# Patient Record
Sex: Female | Born: 1995 | Race: White | Hispanic: No | Marital: Single | Smoking: Current every day smoker
Health system: Southern US, Community
[De-identification: ages and names within clinical notes are randomized; demographics above are authoritative.]

## PROBLEM LIST (undated history)

## (undated) DIAGNOSIS — F32A Depression, unspecified: Secondary | ICD-10-CM

## (undated) DIAGNOSIS — J45909 Unspecified asthma, uncomplicated: Secondary | ICD-10-CM

## (undated) DIAGNOSIS — F909 Attention-deficit hyperactivity disorder, unspecified type: Secondary | ICD-10-CM

## (undated) DIAGNOSIS — F329 Major depressive disorder, single episode, unspecified: Secondary | ICD-10-CM

## (undated) HISTORY — PX: TYMPANOPLASTY: SHX33

## (undated) HISTORY — PX: TONSILLECTOMY AND ADENOIDECTOMY: SUR1326

## (undated) HISTORY — PX: TOOTH EXTRACTION: SUR596

---
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
## 2017-06-15 ENCOUNTER — Other Ambulatory Visit: Payer: Self-pay

## 2017-06-15 ENCOUNTER — Ambulatory Visit
Admission: EM | Admit: 2017-06-15 | Discharge: 2017-06-15 | Disposition: A | Discharge status: Home / Self Care | Payer: Medicaid Other | Attending: Family Medicine | Admitting: Family Medicine

## 2017-06-15 ENCOUNTER — Encounter: Payer: Self-pay | Admitting: Emergency Medicine

## 2017-06-15 DIAGNOSIS — R21 Rash and other nonspecific skin eruption: Secondary | ICD-10-CM

## 2017-06-15 MED ORDER — PREDNISONE 10 MG PO TABS: ORAL_TABLET | ORAL | 0 refills | Status: DC

## 2017-06-15 NOTE — ED Provider Notes (Signed)
[facility]    CSN: [id number] Arrival date & time: 06/15/17  1415  History   Chief Complaint Chief Complaint  Patient presents with  . Rash   HPI  21 year old female presents with rash.  Started Thanksgiving day.  Located on the arms, armpits, back.  Severe, worsening. Severely pruritic.  She has been applying calamine lotion without improvement.  She feels like it may be poison oak or poison ivy but is unsure of being exposed.  No known exacerbating factors.  No other associated symptoms.  No other complaints this time.  PMH - See problem list  Patient Active Problem List   Diagnosis Date Noted  . Suicidal ideation 10/03/2016  . Severe recurrent major depression without psychotic features (HCC) 10/02/2016  . MDD (major depressive disorder), recurrent episode, moderate (HCC) 05/15/2016  . Cannabis use disorder, moderate, dependence (HCC) 05/15/2016  . Methamphetamine use disorder, moderate (HCC) 05/15/2016  . Tobacco use disorder 05/14/2016  . Borderline personality disorder (HCC) 05/14/2016  . PTSD (post-traumatic stress disorder) 05/13/2016  . Suicide attempt (HCC) 09/26/2011   Past Surgical History:  Procedure Laterality Date  . TONSILLECTOMY AND ADENOIDECTOMY  Age 21  . TOOTH EXTRACTION  age 21  . TYMPANOPLASTY     OB History    No data available     Home Medications    Prior to Admission medications   Medication Sig Start Date End Date Taking? Authorizing Provider  albuterol (PROVENTIL HFA;VENTOLIN HFA) 108 (90 Base) MCG/ACT inhaler Inhale 1-2 puffs into the lungs every 6 (six) hours as needed for wheezing or shortness of breath. 09/06/16  Yes Payton Mccallumonty, [place], MD  traZODone (DESYREL) 50 MG tablet Take 1 tablet (50 mg total) by mouth at bedtime. 10/06/16  Yes Pucilowska, Jolanta B, MD  UNABLE TO FIND L-Mythel Folate 15mg  daily   Yes [provider]  venlafaxine XR (EFFEXOR-XR) 150 MG 24 hr capsule Take 150 mg by mouth daily with breakfast.    Yes [provider]  cetirizine (ZYRTEC) 10 MG tablet Take 1 tablet by mouth daily. 11/13/16   [provider]  ondansetron (ZOFRAN ODT) 8 MG disintegrating tablet Take 1 tablet (8 mg total) by mouth 2 (two) times daily. 12/24/16   Lutricia Feiloemer, William P, PA-C  predniSONE (DELTASONE) 10 MG tablet 50 mg (5 tablets) daily x 3 days, then 40 mg (4 tablets) daily x 3 days, then 30 mg (3 tablets) daily x 3 days, then 20 mg (2 tablets) daily x 3 days, then 10 mg (1 tablet) daily x 3 days. 06/15/17   Tommie Samsook, Deborrah Mabin G, DO   Family History Family History  Problem Relation Age of Onset  . Mental illness Mother   . Depression Father    Social History Social History   Tobacco Use  . Smoking status: Current Every Day Smoker    Packs/day: 0.25    Types: Cigarettes  . Smokeless tobacco: Never Used  Substance Use Topics  . Alcohol use: Yes    Frequency: Never    Comment: uses less than once monthly  . Drug use: Yes    Types: Marijuana    Comment: last smoked a couple of days ago   Allergies   Folic acid and Nickel   Review of Systems Review of Systems  Constitutional: Negative.   Skin: Positive for rash.       Itching.    Physical Exam Triage Vital Signs ED Triage Vitals  Enc Vitals Group     BP 06/15/17 1429  115/63     Pulse Rate 06/15/17 1429 82     Resp 06/15/17 1429 16     Temp 06/15/17 1429 98.3 F (36.8 C)     Temp Source 06/15/17 1429 Oral     SpO2 06/15/17 1429 99 %     Weight 06/15/17 1429 210 lb (95.3 kg)     Height 06/15/17 1429 5\' 3"  (1.6 m)     Head Circumference --      Peak Flow --      Pain Score 06/15/17 1430 5     Pain Loc --      Pain Edu? --      Excl. in GC? --    No data found.  Updated Vital Signs BP 115/63 (BP Location: Left Arm)   Pulse 82   Temp 98.3 F (36.8 C) (Oral)   Resp 16   Ht 5\' 3"  (1.6 m)   Wt 210 lb (95.3 kg)   LMP 06/10/2017 (Approximate)   SpO2 99%   BMI 37.20 kg/m   Visual Acuity Right Eye Distance:   Left Eye  Distance:   Bilateral Distance:    Right Eye Near:   Left Eye Near:    Bilateral Near:     Physical Exam  Constitutional: She is oriented to person, place, and time. She appears well-developed. No distress.  HENT:  Head: Normocephalic and atraumatic.  Nose: Nose normal.  Eyes: Conjunctivae are normal. No scleral icterus.  Cardiovascular: Normal rate and regular rhythm.  Pulmonary/Chest: Effort normal and breath sounds normal.  Neurological: She is alert and oriented to person, place, and time.  Skin:  Diffuse erythematous papular rash noted. Worse in the axilla.  Psychiatric: She has a normal mood and affect. Her behavior is normal.  Vitals reviewed.  UC Treatments / Results  Labs (all labs ordered are listed, but only abnormal results are displayed) Labs Reviewed - No data to display  EKG  EKG Interpretation None       Radiology No results found.  Procedures Procedures (including critical care time)  Medications Ordered in UC Medications - No data to display   Initial Impression / Assessment and Plan / UC Course  I have reviewed the triage vital signs and the nursing notes.  Pertinent labs & imaging results that were available during my care of the patient were reviewed by me and considered in my medical decision making (see chart for details).     21 year old female presents with rash. Contact dermatitis (possible poison ivy/oak/sumac). Treating with Prednisone. Warned about potential mood disturbance.  Final Clinical Impressions(s) / UC Diagnoses   Final diagnoses:  Rash    ED Discharge Orders        Ordered    predniSONE (DELTASONE) 10 MG tablet     06/15/17 1450     Controlled Substance Prescriptions [place] Controlled Substance Registry consulted? Not Applicable   Tommie SamsCook, Othella Slappey G, DO 06/15/17 1529

## 2017-06-15 NOTE — Discharge Instructions (Signed)
Prednisone as prescribed. ° °Take care ° °Dr. Hannan Tetzlaff  °

## 2017-06-15 NOTE — ED Triage Notes (Signed)
Patient in today c/o 4 days of rash. Patient states the rash is very itchy. She has used Calamine lotion without relief. Patient denies any new detergents, lotions or soaps.

## 2017-06-19 IMAGING — CR DG CHEST 2V
2 series · 2 of 2 positions shown · non-contrast
Comparison: None.

CLINICAL DATA: Cough and shortness of breath for 1 week.

EXAM:
CHEST  2 VIEW

[chest pa]
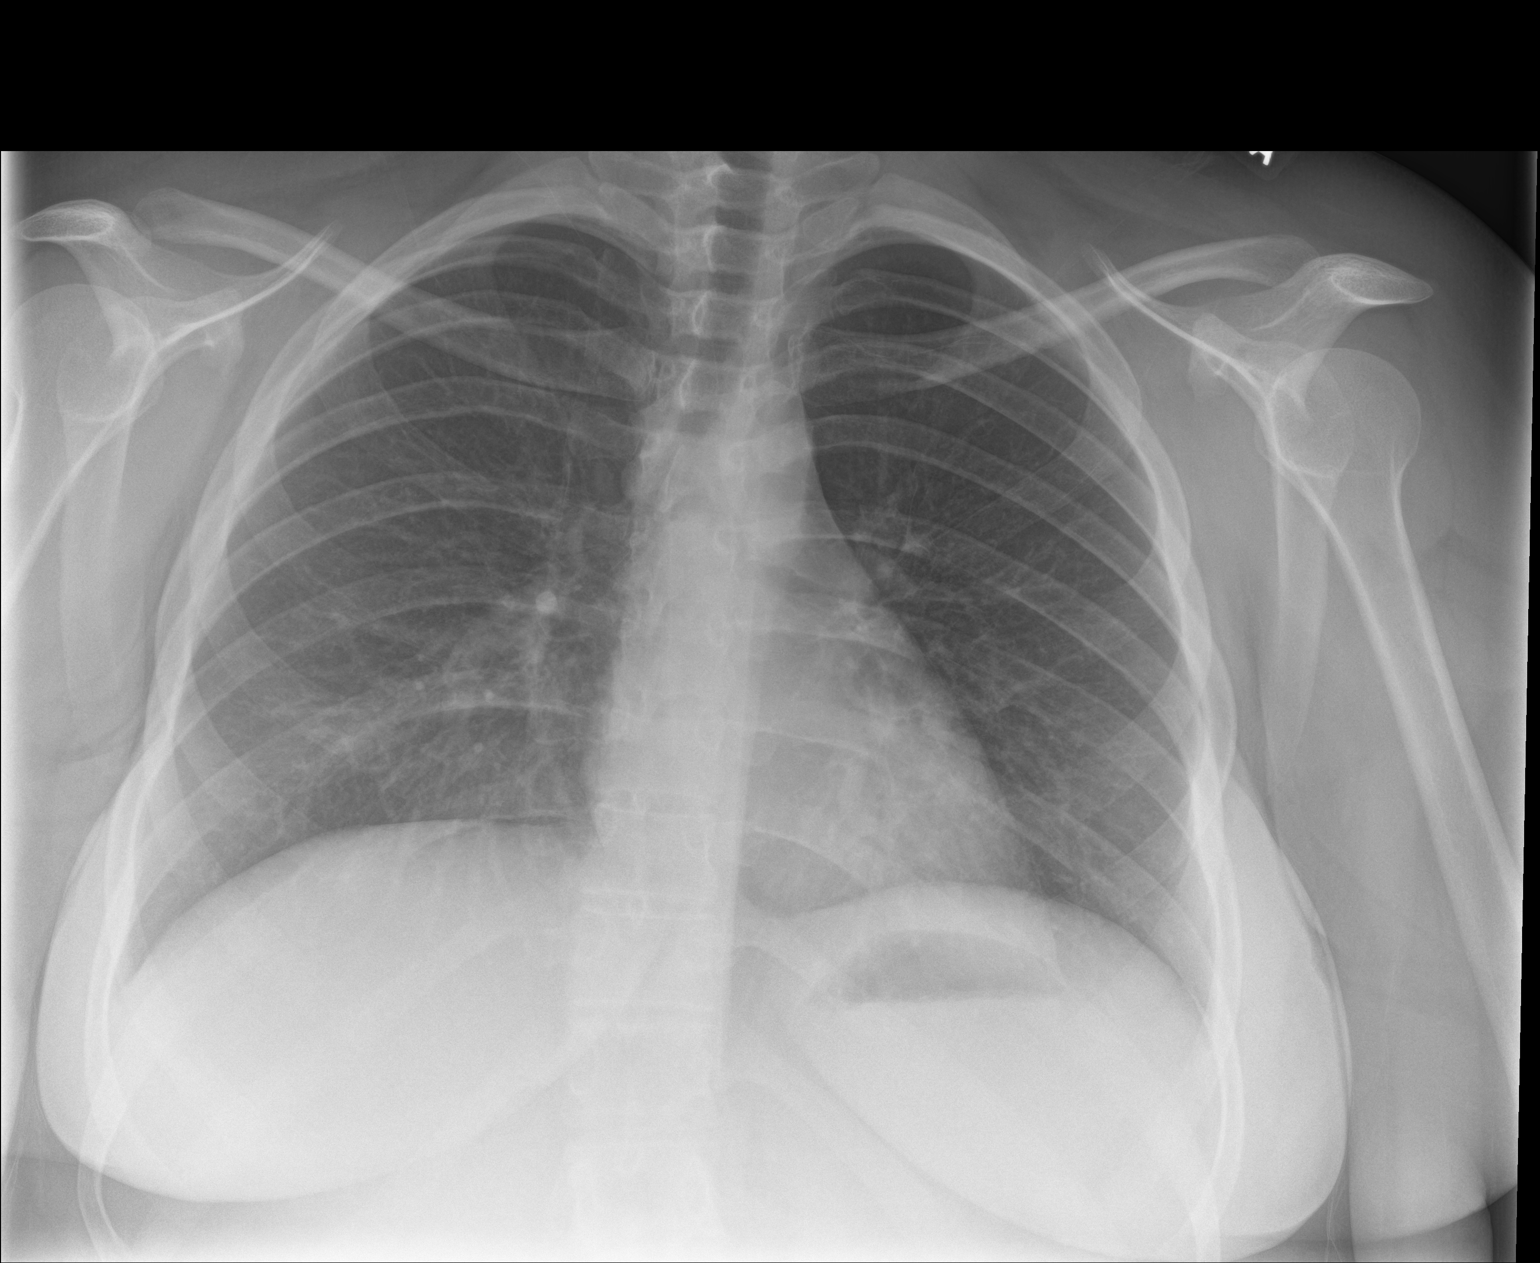

[chest lat]
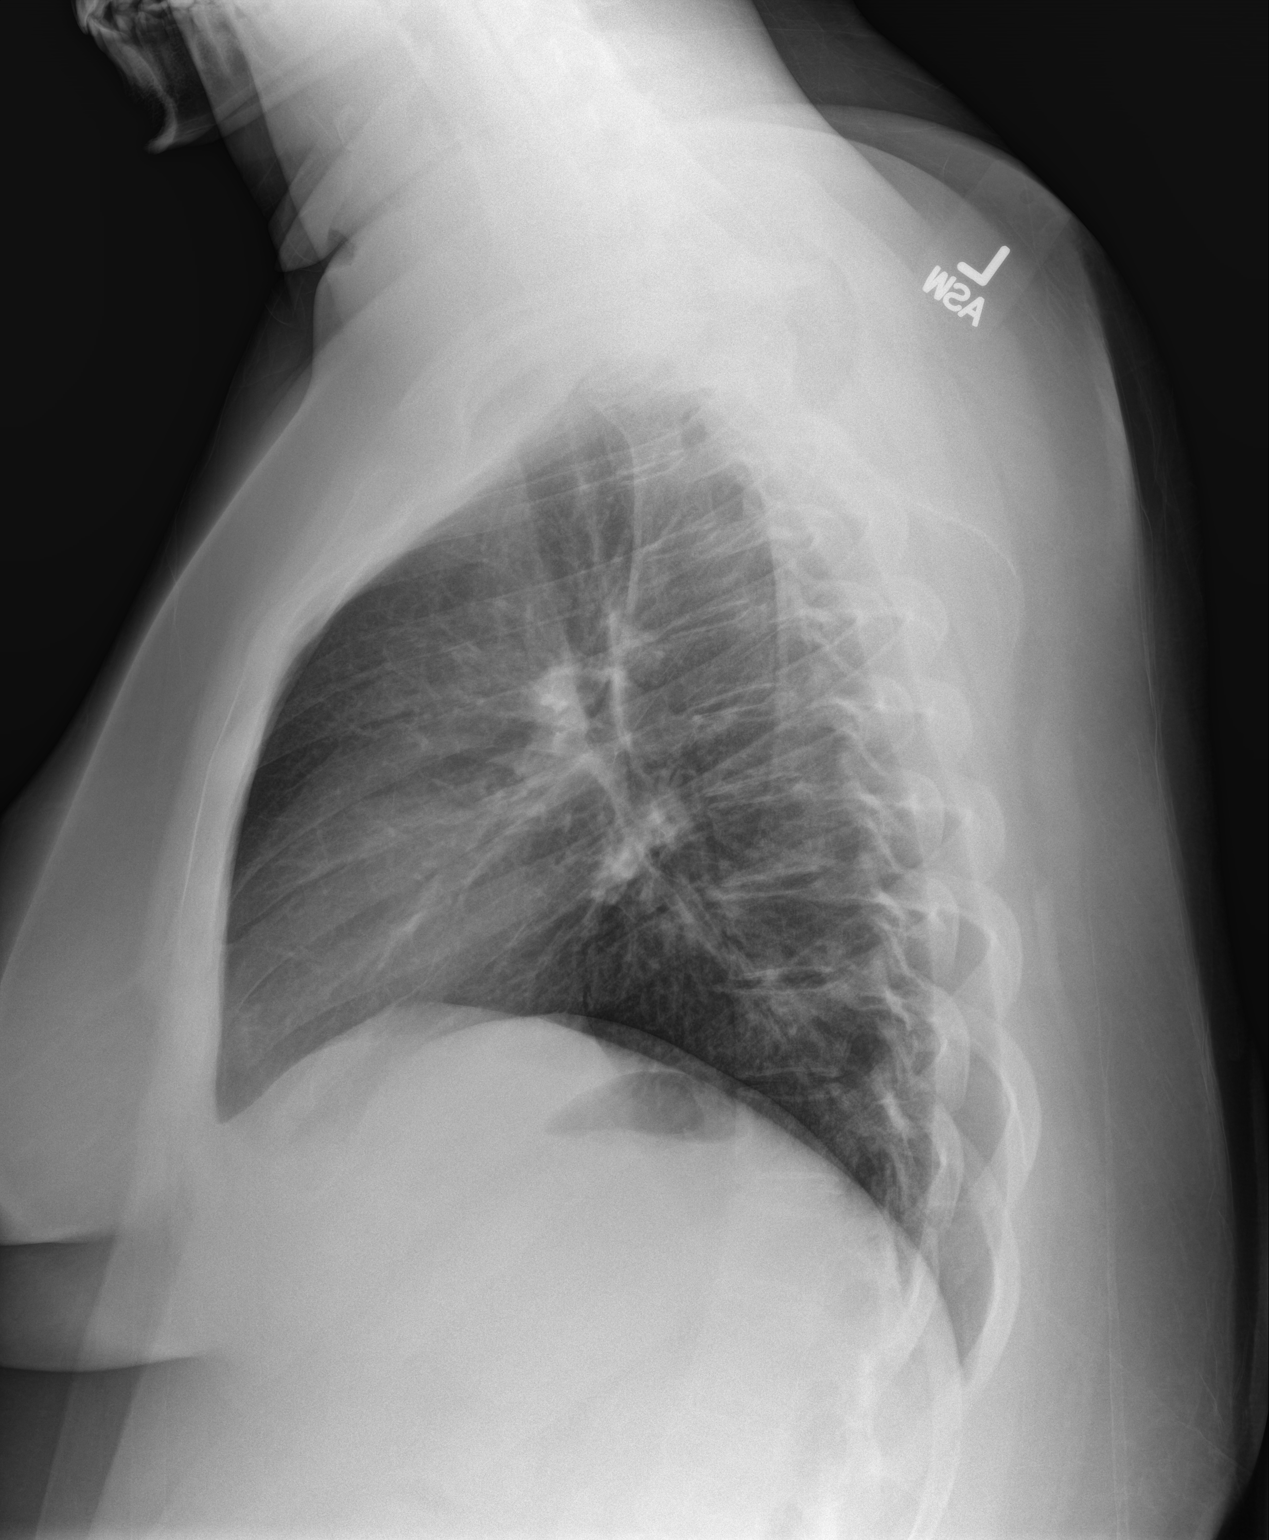

[2 of 2 positions shown; findings below may reference images not displayed]

FINDINGS: The heart size and mediastinal contours are within normal limits.
Both lungs are clear. The visualized skeletal structures are
unremarkable.
IMPRESSION: Normal examination.

## 2017-08-12 ENCOUNTER — Ambulatory Visit
Admission: EM | Admit: 2017-08-12 | Discharge: 2017-08-12 | Disposition: A | Discharge status: Home / Self Care | Payer: Medicaid Other | Attending: Student | Admitting: Student

## 2017-08-12 ENCOUNTER — Other Ambulatory Visit: Payer: Self-pay

## 2017-08-12 ENCOUNTER — Encounter: Payer: Self-pay | Admitting: Emergency Medicine

## 2017-08-12 DIAGNOSIS — J069 Acute upper respiratory infection, unspecified: Secondary | ICD-10-CM

## 2017-08-12 DIAGNOSIS — B9789 Other viral agents as the cause of diseases classified elsewhere: Principal | ICD-10-CM

## 2017-08-12 HISTORY — DX: Unspecified asthma, uncomplicated: J45.909

## 2017-08-12 MED ORDER — GUAIFENESIN 100 MG/5ML PO LIQD: 100.0000 mg | Freq: Every evening | ORAL | 0 refills | Status: DC | PRN

## 2017-08-12 MED ORDER — AZITHROMYCIN 250 MG PO TABS: ORAL_TABLET | ORAL | 0 refills | Status: DC

## 2017-08-12 MED ORDER — BENZONATATE 100 MG PO CAPS: 100.0000 mg | ORAL_CAPSULE | Freq: Three times a day (TID) | ORAL | 0 refills | Status: DC

## 2017-08-12 NOTE — ED Triage Notes (Signed)
Patient c/o cough and chest congestion that started last night.  

## 2017-08-12 NOTE — Discharge Instructions (Signed)
-  Take z-pak as instructed. -Take inhaler daily at home. -Zyrtec daily. -Follow-up with our office if symptoms don't improve.

## 2017-08-12 NOTE — ED Provider Notes (Signed)
[facility]    CSN: [id number] Arrival date & time: 08/12/17  1933     History   Chief Complaint Chief Complaint  Patient presents with  . Cough    HPI Morgan Meadows is a 22 y.o. female who presents today for a 1 day history of increased cough.  Patient denies any facial pressure or sinus pressure.  She denies any production with her cough.  She has not cough to the point of throwing up.  She reports having bronchitis during the winter season last year.  She does have albuterol inhaler at home, she states that she has been taking in the last 2 days.  Denies any fevers at home.  Denies any bloody sputum.  Denies any headaches today.  She is unable to take prednisone due to a reaction she states that she has had in the past.  Oxygen levels at today's visit is 99%.  HPI  Past Medical History:  Diagnosis Date  . ADHD (attention deficit hyperactivity disorder)   . Asthma     Patient Active Problem List   Diagnosis Date Noted  . Suicidal ideation 10/03/2016  . Severe recurrent major depression without psychotic features (HCC) 10/02/2016  . MDD (major depressive disorder), recurrent episode, moderate (HCC) 05/15/2016  . Cannabis use disorder, moderate, dependence (HCC) 05/15/2016  . Methamphetamine use disorder, moderate (HCC) 05/15/2016  . Tobacco use disorder 05/14/2016  . Borderline personality disorder (HCC) 05/14/2016  . PTSD (post-traumatic stress disorder) 05/13/2016  . Suicide attempt (HCC) 09/26/2011    Past Surgical History:  Procedure Laterality Date  . TONSILLECTOMY AND ADENOIDECTOMY  Age 76  . TOOTH EXTRACTION  age 27  . TYMPANOPLASTY      OB History    No data available       Home Medications    Prior to Admission medications   Medication Sig Start Date End Date Taking? Authorizing Provider  albuterol (PROVENTIL HFA;VENTOLIN HFA) 108 (90 Base) MCG/ACT inhaler Inhale 1-2 puffs into the lungs every 6 (six) hours as needed for wheezing or  shortness of breath. 09/06/16  Yes Payton Mccallum, MD  traZODone (DESYREL) 50 MG tablet Take 1 tablet (50 mg total) by mouth at bedtime. 10/06/16  Yes Pucilowska, Jolanta B, MD  venlafaxine XR (EFFEXOR-XR) 150 MG 24 hr capsule Take 150 mg by mouth daily with breakfast.   Yes [provider]  azithromycin (ZITHROMAX Z-PAK) 250 MG tablet Take 2 tablets on day 1 and 1 tablet the next 4 days 08/12/17   Anson Oregon, PA-C  benzonatate (TESSALON) 100 MG capsule Take 1 capsule (100 mg total) by mouth every 8 (eight) hours. 08/12/17   Anson [place], PA-C  cetirizine (ZYRTEC) 10 MG tablet Take 1 tablet by mouth daily. 11/13/16   [provider]  guaiFENesin (ROBITUSSIN) 100 MG/5ML liquid Take 5-10 mLs (100-200 mg total) by mouth at bedtime as needed for cough. 08/12/17   Anson [place], PA-C  ondansetron (ZOFRAN ODT) 8 MG disintegrating tablet Take 1 tablet (8 mg total) by mouth 2 (two) times daily. 12/24/16   Lutricia Feil, PA-C  predniSONE (DELTASONE) 10 MG tablet 50 mg (5 tablets) daily x 3 days, then 40 mg (4 tablets) daily x 3 days, then 30 mg (3 tablets) daily x 3 days, then 20 mg (2 tablets) daily x 3 days, then 10 mg (1 tablet) daily x 3 days. 06/15/17   Cook, Verdis Frederickson, DO  UNABLE TO FIND L-Mythel Folate 15mg  daily  [provider]    Family History Family History  Problem Relation Age of Onset  . Mental illness Mother   . Depression Father     Social History Social History   Tobacco Use  . Smoking status: Current Every Day Smoker    Packs/day: 0.25    Types: Cigarettes  . Smokeless tobacco: Never Used  Substance Use Topics  . Alcohol use: Yes    Frequency: Never    Comment: uses less than once monthly  . Drug use: Yes    Types: Marijuana    Comment: last smoked a couple of days ago     Allergies   Folic acid and Nickel   Review of Systems Review of Systems  Constitutional: Negative for fever.  HENT: Positive for rhinorrhea and sore  throat.   Eyes: Negative.   Respiratory: Positive for cough and shortness of breath.   Cardiovascular: Negative.   Gastrointestinal: Negative for diarrhea and nausea.  Endocrine: Negative.   Genitourinary: Negative.   Musculoskeletal: Negative.   Skin: Negative.   Allergic/Immunologic: Negative.   Neurological: Negative.   Hematological: Negative.   Psychiatric/Behavioral: Negative.      Physical Exam Triage Vital Signs ED Triage Vitals  Enc Vitals Group     BP 08/12/17 1945 124/73     Pulse Rate 08/12/17 1945 98     Resp 08/12/17 1945 16     Temp 08/12/17 1945 98.4 F (36.9 C)     Temp Source 08/12/17 1945 Oral     SpO2 08/12/17 1945 99 %     Weight 08/12/17 1941 207 lb (93.9 kg)     Height 08/12/17 1941 5\' 3"  (1.6 m)     Head Circumference --      Peak Flow --      Pain Score 08/12/17 1941 6     Pain Loc --      Pain Edu? --      Excl. in GC? --    No data found.  Updated Vital Signs BP 124/73 (BP Location: Left Arm)   Pulse 98   Temp 98.4 F (36.9 C) (Oral)   Resp 16   Ht 5\' 3"  (1.6 m)   Wt 207 lb (93.9 kg)   LMP 07/13/2017 (Approximate)   SpO2 99%   BMI 36.67 kg/m   Visual Acuity Right Eye Distance:   Left Eye Distance:   Bilateral Distance:    Right Eye Near:   Left Eye Near:    Bilateral Near:     Physical Exam  Constitutional: Vital signs are normal. She appears well-developed and well-nourished.  HENT:  Bilateral nasal erythema present. Tympanic membranes normal without bulging. Oropharynx nonerythematous without exudate. No pain with palpation over sinuses. No cervical adenopathy.  Cardiovascular: Regular rhythm, S1 normal, S2 normal and normal heart sounds.  Pulmonary/Chest:  Moderate wheezing to bilateral upper lobes.  No stridor present.  Patient does not appear distressed with breathing.  Neurological: She is alert.  Skin:  Scarring present to bilateral forearms likely due to previous history of cutting.   UC Treatments / Results   Labs (all labs ordered are listed, but only abnormal results are displayed) Labs Reviewed - No data to display  EKG  EKG Interpretation None       Radiology No results found.  Procedures Procedures (including critical care time)  Medications Ordered in UC Medications - No data to display   Initial Impression / Assessment and Plan / UC Course  I have  reviewed the triage vital signs and the nursing notes.  Pertinent labs & imaging results that were available during my care of the patient were reviewed by me and considered in my medical decision making (see chart for details).   1.  Treatment options were discussed today. 2.  Z-pak prescribed to patient.  Encouraged to start daily Zyrtec and inhaler at home. 3.  Tessalon pearls and Robitussin called into the patient's pharmacy. 4.  If no improvement follow-up for nebulizer treatment, did not feel necessary today as patient does not appear distress and SpO2 99% today. 5.  Follow-up with Urgent Care if no improvement of symptoms.  Final Clinical Impressions(s) / UC Diagnoses   Final diagnoses:  Viral URI with cough    ED Discharge Orders        Ordered    azithromycin (ZITHROMAX Z-PAK) 250 MG tablet     08/12/17 1959    benzonatate (TESSALON) 100 MG capsule  Every 8 hours     08/12/17 2005    guaiFENesin (ROBITUSSIN) 100 MG/5ML liquid  At bedtime PRN     08/12/17 2005      Controlled Substance Prescriptions [place] Controlled Substance Registry consulted? No   Anson Oregon, [place] 08/12/17 2008

## 2017-08-14 ENCOUNTER — Ambulatory Visit
Admission: EM | Admit: 2017-08-14 | Discharge: 2017-08-14 | Disposition: A | Discharge status: Home / Self Care | Payer: Medicaid Other | Attending: Family Medicine | Admitting: Family Medicine

## 2017-08-14 ENCOUNTER — Encounter: Payer: Self-pay | Admitting: *Deleted

## 2017-08-14 ENCOUNTER — Other Ambulatory Visit: Payer: Self-pay

## 2017-08-14 DIAGNOSIS — R51 Headache: Secondary | ICD-10-CM | POA: Diagnosis not present

## 2017-08-14 DIAGNOSIS — R509 Fever, unspecified: Secondary | ICD-10-CM | POA: Diagnosis not present

## 2017-08-14 DIAGNOSIS — R05 Cough: Secondary | ICD-10-CM

## 2017-08-14 DIAGNOSIS — M791 Myalgia, unspecified site: Secondary | ICD-10-CM | POA: Diagnosis not present

## 2017-08-14 DIAGNOSIS — R69 Illness, unspecified: Secondary | ICD-10-CM | POA: Diagnosis not present

## 2017-08-14 DIAGNOSIS — J111 Influenza due to unidentified influenza virus with other respiratory manifestations: Secondary | ICD-10-CM

## 2017-08-14 HISTORY — DX: Depression, unspecified: F32.A

## 2017-08-14 HISTORY — DX: Major depressive disorder, single episode, unspecified: F32.9

## 2017-08-14 MED ORDER — OSELTAMIVIR PHOSPHATE 75 MG PO CAPS: 75.0000 mg | ORAL_CAPSULE | Freq: Two times a day (BID) | ORAL | 0 refills | Status: DC

## 2017-08-14 NOTE — ED Triage Notes (Signed)
PAtient continues to have persistent cough and body aches since last visit on 08/12/17.

## 2017-08-14 NOTE — ED Provider Notes (Signed)
[facility]    CSN: [id number] Arrival date & time: 08/14/17  4098     History   Chief Complaint Chief Complaint  Patient presents with  . Cough  . Generalized Body Aches    HPI Morgan Meadows is a 22 y.o. female.   The history is provided by the patient.  Cough  Associated symptoms: fever, headaches, myalgias and rhinorrhea   URI  Presenting symptoms: congestion, cough, fatigue, fever and rhinorrhea   Severity:  Moderate Onset quality:  Sudden Duration:  3 days Timing:  Constant Progression:  Worsening Chronicity:  New Relieved by:  Nothing Ineffective treatments:  Prescription medications and OTC medications Associated symptoms: headaches and myalgias   Risk factors: sick contacts   Risk factors: not elderly, no chronic cardiac disease, no chronic kidney disease, no chronic respiratory disease, no diabetes mellitus, no immunosuppression, no recent illness and no recent travel     Past Medical History:  Diagnosis Date  . ADHD (attention deficit hyperactivity disorder)   . Asthma   . Depression     Patient Active Problem List   Diagnosis Date Noted  . Suicidal ideation 10/03/2016  . Severe recurrent major depression without psychotic features (HCC) 10/02/2016  . MDD (major depressive disorder), recurrent episode, moderate (HCC) 05/15/2016  . Cannabis use disorder, moderate, dependence (HCC) 05/15/2016  . Methamphetamine use disorder, moderate (HCC) 05/15/2016  . Tobacco use disorder 05/14/2016  . Borderline personality disorder (HCC) 05/14/2016  . PTSD (post-traumatic stress disorder) 05/13/2016  . Suicide attempt (HCC) 09/26/2011    Past Surgical History:  Procedure Laterality Date  . TONSILLECTOMY AND ADENOIDECTOMY  Age 65  . TOOTH EXTRACTION  age 75  . TYMPANOPLASTY      OB History    No data available       Home Medications    Prior to Admission medications   Medication Sig Start Date End Date Taking? Authorizing Provider    albuterol (PROVENTIL HFA;VENTOLIN HFA) 108 (90 Base) MCG/ACT inhaler Inhale 1-2 puffs into the lungs every 6 (six) hours as needed for wheezing or shortness of breath. 09/06/16  Yes Payton Mccallum, MD  azithromycin (ZITHROMAX Z-PAK) 250 MG tablet Take 2 tablets on day 1 and 1 tablet the next 4 days 08/12/17  Yes Anson [place], PA-C  benzonatate (TESSALON) 100 MG capsule Take 1 capsule (100 mg total) by mouth every 8 (eight) hours. 08/12/17  Yes Anson [place], PA-C  cetirizine (ZYRTEC) 10 MG tablet Take 1 tablet by mouth daily. 11/13/16  Yes [provider]  traZODone (DESYREL) 50 MG tablet Take 1 tablet (50 mg total) by mouth at bedtime. 10/06/16  Yes Pucilowska, Jolanta B, MD  venlafaxine XR (EFFEXOR-XR) 150 MG 24 hr capsule Take 150 mg by mouth daily with breakfast.   Yes [provider]  guaiFENesin (ROBITUSSIN) 100 MG/5ML liquid Take 5-10 mLs (100-200 mg total) by mouth at bedtime as needed for cough. 08/12/17   Anson [place], PA-C  ondansetron (ZOFRAN ODT) 8 MG disintegrating tablet Take 1 tablet (8 mg total) by mouth 2 (two) times daily. 12/24/16   Lutricia Feil, PA-C  oseltamivir (TAMIFLU) 75 MG capsule Take 1 capsule (75 mg total) by mouth 2 (two) times daily. 08/14/17   Payton Mccallum, MD  predniSONE (DELTASONE) 10 MG tablet 50 mg (5 tablets) daily x 3 days, then 40 mg (4 tablets) daily x 3 days, then 30 mg (3 tablets) daily x 3 days, then 20 mg (2 tablets) daily x  3 days, then 10 mg (1 tablet) daily x 3 days. 06/15/17   Tommie Samsook, Jayce G, DO  UNABLE TO FIND L-Mythel Folate 15mg  daily    [provider]    Family History Family History  Problem Relation Age of Onset  . Mental illness Mother   . Depression Father     Social History Social History   Tobacco Use  . Smoking status: Current Every Day Smoker    Packs/day: 0.25    Types: Cigarettes  . Smokeless tobacco: Never Used  Substance Use Topics  . Alcohol use: Yes    Frequency: Never     Comment: uses less than once monthly  . Drug use: Yes    Types: Marijuana    Comment: last smoked a couple of days ago     Allergies   Folic acid; Prednisone; and Nickel   Review of Systems Review of Systems  Constitutional: Positive for fatigue and fever.  HENT: Positive for congestion and rhinorrhea.   Respiratory: Positive for cough.   Musculoskeletal: Positive for myalgias.  Neurological: Positive for headaches.     Physical Exam Triage Vital Signs ED Triage Vitals  Enc Vitals Group     BP 08/14/17 0839 (!) 114/52     Pulse Rate 08/14/17 0839 (!) 113     Resp 08/14/17 0839 18     Temp 08/14/17 0839 99.2 F (37.3 C)     Temp Source 08/14/17 0839 Oral     SpO2 08/14/17 0839 96 %     Weight --      Height --      Head Circumference --      Peak Flow --      Pain Score 08/14/17 0840 6     Pain Loc --      Pain Edu? --      Excl. in GC? --    No data found.  Updated Vital Signs BP (!) 114/52 (BP Location: Left Arm)   Pulse (!) 113   Temp 99.2 F (37.3 C) (Oral)   Resp 18   LMP 07/14/2017   SpO2 96%   Visual Acuity Right Eye Distance:   Left Eye Distance:   Bilateral Distance:    Right Eye Near:   Left Eye Near:    Bilateral Near:     Physical Exam  Constitutional: She appears well-developed and well-nourished. No distress.  HENT:  Head: Normocephalic and atraumatic.  Right Ear: Tympanic membrane, external ear and ear canal normal.  Left Ear: Tympanic membrane, external ear and ear canal normal.  Nose: Mucosal edema and rhinorrhea present. No nose lacerations, sinus tenderness, nasal deformity, septal deviation or nasal septal hematoma. No epistaxis.  No foreign bodies. Right sinus exhibits no maxillary sinus tenderness and no frontal sinus tenderness. Left sinus exhibits no maxillary sinus tenderness and no frontal sinus tenderness.  Mouth/Throat: Uvula is midline, oropharynx is clear and moist and mucous membranes are normal. No oropharyngeal  exudate.  Eyes: Conjunctivae and EOM are normal. Pupils are equal, round, and reactive to light. Right eye exhibits no discharge. Left eye exhibits no discharge. No scleral icterus.  Neck: Normal range of motion. Neck supple. No thyromegaly present.  Cardiovascular: Normal rate, regular rhythm and normal heart sounds.  Pulmonary/Chest: Effort normal and breath sounds normal. No stridor. No respiratory distress. She has no wheezes. She has no rales.  Lymphadenopathy:    She has no cervical adenopathy.  Skin: She is not diaphoretic.  Nursing note and vitals  reviewed.    UC Treatments / Results  Labs (all labs ordered are listed, but only abnormal results are displayed) Labs Reviewed - No data to display  EKG  EKG Interpretation None       Radiology No results found.  Procedures Procedures (including critical care time)  Medications Ordered in UC Medications - No data to display   Initial Impression / Assessment and Plan / UC Course  I have reviewed the triage vital signs and the nursing notes.  Pertinent labs & imaging results that were available during my care of the patient were reviewed by me and considered in my medical decision making (see chart for details).       Final Clinical Impressions(s) / UC Diagnoses   Final diagnoses:  Influenza-like illness    ED Discharge Orders        Ordered    oseltamivir (TAMIFLU) 75 MG capsule  2 times daily     08/14/17 0902     1. diagnosis reviewed with patient 2. rx as per orders above; reviewed possible side effects, interactions, risks and benefits  3. Recommend supportive treatment with rest, fluids, otc analgesics prn 4. Follow-up prn if symptoms worsen or don't improve   Controlled Substance Prescriptions [place] Controlled Substance Registry consulted? Not Applicable   Payton Mccallum, MD 08/14/17 [phone]

## 2017-08-16 ENCOUNTER — Emergency Department: Payer: Medicaid Other

## 2017-08-16 ENCOUNTER — Emergency Department
Admission: EM | Admit: 2017-08-16 | Discharge: 2017-08-16 | Disposition: A | Discharge status: Home / Self Care | Payer: Medicaid Other | Attending: Emergency Medicine | Admitting: Emergency Medicine

## 2017-08-16 ENCOUNTER — Other Ambulatory Visit: Payer: Self-pay

## 2017-08-16 ENCOUNTER — Encounter: Payer: Self-pay | Admitting: Emergency Medicine

## 2017-08-16 DIAGNOSIS — F17210 Nicotine dependence, cigarettes, uncomplicated: Secondary | ICD-10-CM | POA: Insufficient documentation

## 2017-08-16 DIAGNOSIS — R05 Cough: Secondary | ICD-10-CM | POA: Diagnosis present

## 2017-08-16 DIAGNOSIS — J45909 Unspecified asthma, uncomplicated: Secondary | ICD-10-CM | POA: Diagnosis not present

## 2017-08-16 DIAGNOSIS — Z79899 Other long term (current) drug therapy: Secondary | ICD-10-CM | POA: Diagnosis not present

## 2017-08-16 DIAGNOSIS — J111 Influenza due to unidentified influenza virus with other respiratory manifestations: Secondary | ICD-10-CM

## 2017-08-16 MED ORDER — GUAIFENESIN-CODEINE 100-10 MG/5ML PO SOLN: 10.0000 mL | Freq: Three times a day (TID) | ORAL | 0 refills | Status: DC | PRN

## 2017-08-16 NOTE — ED Provider Notes (Signed)
[facility] Emergency Department Provider Note  ____________________________________________  Time seen: Approximately 5:57 PM  I have reviewed the triage vital signs and the nursing notes.   HISTORY  Chief Complaint Influenza   HPI LEKHA DANCER is a 22 y.o. female who presents to the emergency department for evaluation of cough. She was diagnosed with the flulast week and continues to feel bad. She states her chest is burning when she coughs. No relief with tessalon. She finished the Tamiflu. She has also taken ibuprofen without relief.   Past Medical History:  Diagnosis Date  . ADHD (attention deficit hyperactivity disorder)   . Asthma   . Depression     Patient Active Problem List   Diagnosis Date Noted  . Suicidal ideation 10/03/2016  . Severe recurrent major depression without psychotic features (HCC) 10/02/2016  . MDD (major depressive disorder), recurrent episode, moderate (HCC) 05/15/2016  . Cannabis use disorder, moderate, dependence (HCC) 05/15/2016  . Methamphetamine use disorder, moderate (HCC) 05/15/2016  . Tobacco use disorder 05/14/2016  . Borderline personality disorder (HCC) 05/14/2016  . PTSD (post-traumatic stress disorder) 05/13/2016  . Suicide attempt (HCC) 09/26/2011    Past Surgical History:  Procedure Laterality Date  . TONSILLECTOMY AND ADENOIDECTOMY  Age 89  . TOOTH EXTRACTION  age 78  . TYMPANOPLASTY      Prior to Admission medications   Medication Sig Start Date End Date Taking? Authorizing Provider  albuterol (PROVENTIL HFA;VENTOLIN HFA) 108 (90 Base) MCG/ACT inhaler Inhale 1-2 puffs into the lungs every 6 (six) hours as needed for wheezing or shortness of breath. 09/06/16   Payton Mccallum, MD  azithromycin (ZITHROMAX Z-PAK) 250 MG tablet Take 2 tablets on day 1 and 1 tablet the next 4 days 08/12/17   Anson Oregon, PA-C  benzonatate (TESSALON) 100 MG capsule Take 1 capsule (100 mg total) by mouth every 8  (eight) hours. 08/12/17   Anson Oregon, PA-C  cetirizine (ZYRTEC) 10 MG tablet Take 1 tablet by mouth daily. 11/13/16   [provider]  guaiFENesin (ROBITUSSIN) 100 MG/5ML liquid Take 5-10 mLs (100-200 mg total) by mouth at bedtime as needed for cough. 08/12/17   Anson Oregon, PA-C  guaiFENesin-codeine 100-10 MG/5ML syrup Take 10 mLs by mouth 3 (three) times daily as needed. 08/16/17   Woodroe Vogan B, FNP  ondansetron (ZOFRAN ODT) 8 MG disintegrating tablet Take 1 tablet (8 mg total) by mouth 2 (two) times daily. 12/24/16   Lutricia Feil, PA-C  oseltamivir (TAMIFLU) 75 MG capsule Take 1 capsule (75 mg total) by mouth 2 (two) times daily. 08/14/17   Payton Mccallum, MD  predniSONE (DELTASONE) 10 MG tablet 50 mg (5 tablets) daily x 3 days, then 40 mg (4 tablets) daily x 3 days, then 30 mg (3 tablets) daily x 3 days, then 20 mg (2 tablets) daily x 3 days, then 10 mg (1 tablet) daily x 3 days. 06/15/17   Tommie Sams, DO  traZODone (DESYREL) 50 MG tablet Take 1 tablet (50 mg total) by mouth at bedtime. 10/06/16   Pucilowska, Ellin Goodie, MD  UNABLE TO FIND L-Mythel Folate 15mg  daily    [provider]  venlafaxine XR (EFFEXOR-XR) 150 MG 24 hr capsule Take 150 mg by mouth daily with breakfast.    [provider]    Allergies Folic acid; Prednisone; and Nickel  Family History  Problem Relation Age of Onset  . Mental illness Mother   . Depression Father  Social History Social History   Tobacco Use  . Smoking status: Current Every Day Smoker    Packs/day: 0.25    Types: Cigarettes  . Smokeless tobacco: Never Used  Substance Use Topics  . Alcohol use: Yes    Frequency: Never    Comment: uses less than once monthly  . Drug use: Yes    Types: Marijuana    Comment: last smoked a couple of days ago    Review of Systems Constitutional: Positive for fever/chills ENT: Positive for sore throat. Cardiovascular: Denies chest pain. Respiratory: No  shortness of breath. Positive for cough. Gastrointestinal: No nausea,  no vomiting.  No diarrhea.  Musculoskeletal: Positive for body aches Skin: Negative for rash. Neurological: Negative for headaches ____________________________________________   PHYSICAL EXAM:  VITAL SIGNS: ED Triage Vitals  Enc Vitals Group     BP 08/16/17 1636 133/68     Pulse Rate 08/16/17 1636 (!) 104     Resp 08/16/17 1636 20     Temp 08/16/17 1636 99.3 F (37.4 C)     Temp Source 08/16/17 1636 Oral     SpO2 08/16/17 1636 97 %     Weight 08/16/17 1636 207 lb (93.9 kg)     Height 08/16/17 1636 5\' 3"  (1.6 m)     Head Circumference --      Peak Flow --      Pain Score 08/16/17 1644 6     Pain Loc --      Pain Edu? --      Excl. in GC? --     Constitutional: Alert and oriented. Acutely ill appearing and in no acute distress. Eyes: Conjunctivae are normal. EOMI. Ears: Bilateral TM injected. Nose: Pansinus congestion, clear rhinorrhea Throat: Mucous membranes are moist.  Oropharynx erythematous. Tonsils 1+ without exudate.. Neck: No stridor.  Lymphatic: Bilateral anterior cervical lymphadenopathy. Cardiovascular: Normal rate, regular rhythm. Good peripheral circulation. Respiratory: Normal respiratory effort.  No retractions. Clear to auscultation. Gastrointestinal: Soft and nontender.  Musculoskeletal: FROM x 4 extremities.  Neurologic:  Normal speech and language.  Skin:  Skin is warm, dry and intact. No rash noted. Psychiatric: Mood and affect are normal. Speech and behavior are normal.  ____________________________________________   LABS (all labs ordered are listed, but only abnormal results are displayed)  Labs Reviewed - No data to display ____________________________________________  EKG  Not indicated. ____________________________________________  RADIOLOGY  Chest x-ray negative for cardiopulmonary abnormality per  radiology. ____________________________________________   PROCEDURES  Procedure(s) performed: None  Critical Care performed: No ____________________________________________   INITIAL IMPRESSION / ASSESSMENT AND PLAN / ED COURSE  21 year old female presenting to the ER for treatment of symptoms consistent with influenza. She will be given guaifenesin with codeine and encouraged to increase her fluid intake and rest. She is to also continue the tylenol and ibuprofen for pain and fever. She was advised to return to the ER for symptoms that change or worsen if unable to schedule an appointment.  Medications - No data to display  ED Discharge Orders        Ordered    guaiFENesin-codeine 100-10 MG/5ML syrup  3 times daily PRN     08/16/17 1758      Pertinent labs & imaging results that were available during my care of the patient were reviewed by me and considered in my medical decision making (see chart for details).    If controlled substance prescribed during this visit, 12 month history viewed on the [facility] prior to  issuing an initial prescription for Schedule II or III opiod. ____________________________________________   FINAL CLINICAL IMPRESSION(S) / ED DIAGNOSES  Final diagnoses:  Influenza    Note:  This document was prepared using Dragon voice recognition software and may include unintentional dictation errors.     Chinita Pesterriplett, Zabrina Brotherton B, FNP 08/16/17 1903    Sharman CheekStafford, Phillip, MD 08/16/17 2330

## 2017-08-16 NOTE — ED Notes (Signed)
See triage note presents with cough and some discomfort in chest  Describes as burning  [facility] and dx'd with the flu about 4 days ago. Last known fever was 2 days ago  Low grade on arrival

## 2017-08-16 NOTE — ED Triage Notes (Signed)
Pt to ED via POV, pt states that she was seen [facility] a few days ago and diagnosed with flu. Pt states that she was given tamiflu but that she is not getting any better. Pt states that she is still coughing and now her lungs are hurting from coughing. Pt in NAD at this time. Pt able to speak in complete sentences, no distress noted.

## 2017-10-24 ENCOUNTER — Ambulatory Visit
Admission: EM | Admit: 2017-10-24 | Discharge: 2017-10-24 | Discharge status: Against Medical Advice | Payer: Medicaid Other

## 2017-11-21 ENCOUNTER — Ambulatory Visit
Admission: EM | Admit: 2017-11-21 | Discharge: 2017-11-21 | Disposition: A | Discharge status: Home / Self Care | Payer: Medicaid Other | Attending: Family Medicine | Admitting: Family Medicine

## 2017-11-21 ENCOUNTER — Encounter: Payer: Self-pay | Admitting: Emergency Medicine

## 2017-11-21 ENCOUNTER — Other Ambulatory Visit: Payer: Self-pay

## 2017-11-21 ENCOUNTER — Ambulatory Visit: Payer: Medicaid Other

## 2017-11-21 DIAGNOSIS — F603 Borderline personality disorder: Secondary | ICD-10-CM | POA: Diagnosis not present

## 2017-11-21 DIAGNOSIS — F1220 Cannabis dependence, uncomplicated: Secondary | ICD-10-CM | POA: Diagnosis not present

## 2017-11-21 DIAGNOSIS — M25572 Pain in left ankle and joints of left foot: Secondary | ICD-10-CM

## 2017-11-21 DIAGNOSIS — S93402A Sprain of unspecified ligament of left ankle, initial encounter: Secondary | ICD-10-CM | POA: Diagnosis not present

## 2017-11-21 DIAGNOSIS — F4310 Post-traumatic stress disorder, unspecified: Secondary | ICD-10-CM | POA: Insufficient documentation

## 2017-11-21 DIAGNOSIS — F329 Major depressive disorder, single episode, unspecified: Secondary | ICD-10-CM | POA: Diagnosis not present

## 2017-11-21 DIAGNOSIS — W109XXA Fall (on) (from) unspecified stairs and steps, initial encounter: Secondary | ICD-10-CM | POA: Insufficient documentation

## 2017-11-21 DIAGNOSIS — J45909 Unspecified asthma, uncomplicated: Secondary | ICD-10-CM | POA: Insufficient documentation

## 2017-11-21 DIAGNOSIS — Z79899 Other long term (current) drug therapy: Secondary | ICD-10-CM | POA: Diagnosis not present

## 2017-11-21 DIAGNOSIS — F909 Attention-deficit hyperactivity disorder, unspecified type: Secondary | ICD-10-CM | POA: Insufficient documentation

## 2017-11-21 DIAGNOSIS — F1520 Other stimulant dependence, uncomplicated: Secondary | ICD-10-CM | POA: Diagnosis not present

## 2017-11-21 DIAGNOSIS — F17210 Nicotine dependence, cigarettes, uncomplicated: Secondary | ICD-10-CM | POA: Diagnosis not present

## 2017-11-21 MED ORDER — HYDROCODONE-ACETAMINOPHEN 5-325 MG PO TABS: ORAL_TABLET | ORAL | 0 refills | Status: DC

## 2017-11-21 NOTE — ED Triage Notes (Signed)
Patient c/o left foot and ankle pain after falling off her porch last night.

## 2017-11-21 NOTE — ED Provider Notes (Signed)
[facility]    CSN: [id number] Arrival date & time: 11/21/17  1403     History   Chief Complaint Chief Complaint  Patient presents with  . Ankle Pain    left  . Foot Pain    HPI Morgan Meadows is a 22 y.o. female.   22 yo female with a c/o left foot and ankle pain and swelling after falling off a step on her porch last night. Denies any numbness/tingling, fevers, chills.   The history is provided by the patient.  Ankle Pain  Foot Pain     Past Medical History:  Diagnosis Date  . ADHD (attention deficit hyperactivity disorder)   . Asthma   . Depression     Patient Active Problem List   Diagnosis Date Noted  . Suicidal ideation 10/03/2016  . Severe recurrent major depression without psychotic features (HCC) 10/02/2016  . MDD (major depressive disorder), recurrent episode, moderate (HCC) 05/15/2016  . Cannabis use disorder, moderate, dependence (HCC) 05/15/2016  . Methamphetamine use disorder, moderate (HCC) 05/15/2016  . Tobacco use disorder 05/14/2016  . Borderline personality disorder (HCC) 05/14/2016  . PTSD (post-traumatic stress disorder) 05/13/2016  . Suicide attempt (HCC) 09/26/2011    Past Surgical History:  Procedure Laterality Date  . TONSILLECTOMY AND ADENOIDECTOMY  Age 43  . TOOTH EXTRACTION  age 89  . TYMPANOPLASTY      OB History   None      Home Medications    Prior to Admission medications   Medication Sig Start Date End Date Taking? Authorizing Provider  cabergoline (DOSTINEX) 0.5 MG tablet Take by mouth. 09/14/17  Yes [provider]  cetirizine (ZYRTEC) 10 MG tablet Take 1 tablet by mouth daily. 11/13/16  Yes [provider]  etonogestrel (NEXPLANON) 68 MG IMPL implant  08/20/17  Yes [provider]  albuterol (PROVENTIL HFA;VENTOLIN HFA) 108 (90 Base) MCG/ACT inhaler Inhale 1-2 puffs into the lungs every 6 (six) hours as needed for wheezing or shortness of breath. 09/06/16   Payton Mccallum, MD    azithromycin (ZITHROMAX Z-PAK) 250 MG tablet Take 2 tablets on day 1 and 1 tablet the next 4 days 08/12/17   Anson Oregon, PA-C  benzonatate (TESSALON) 100 MG capsule Take 1 capsule (100 mg total) by mouth every 8 (eight) hours. 08/12/17   Anson Oregon, PA-C  guaiFENesin (ROBITUSSIN) 100 MG/5ML liquid Take 5-10 mLs (100-200 mg total) by mouth at bedtime as needed for cough. 08/12/17   Anson Oregon, PA-C  guaiFENesin-codeine 100-10 MG/5ML syrup Take 10 mLs by mouth 3 (three) times daily as needed. 08/16/17   Triplett, Rulon Eisenmenger B, FNP  HYDROcodone-acetaminophen (NORCO/VICODIN) 5-325 MG tablet 1-2 tabs po qd 11/21/17   Payton Mccallum, MD  ondansetron (ZOFRAN ODT) 8 MG disintegrating tablet Take 1 tablet (8 mg total) by mouth 2 (two) times daily. 12/24/16   Lutricia Feil, PA-C  oseltamivir (TAMIFLU) 75 MG capsule Take 1 capsule (75 mg total) by mouth 2 (two) times daily. 08/14/17   Payton Mccallum, MD  predniSONE (DELTASONE) 10 MG tablet 50 mg (5 tablets) daily x 3 days, then 40 mg (4 tablets) daily x 3 days, then 30 mg (3 tablets) daily x 3 days, then 20 mg (2 tablets) daily x 3 days, then 10 mg (1 tablet) daily x 3 days. 06/15/17   Tommie Sams, DO  traZODone (DESYREL) 50 MG tablet Take 1 tablet (50 mg total) by mouth at bedtime. Patient taking differently: Take 150  mg by mouth at bedtime.  10/06/16   Pucilowska, Ellin Goodie, MD  UNABLE TO FIND L-Mythel Folate  daily    [provider]  venlafaxine XR (EFFEXOR-XR) 150 MG 24 hr capsule Take 150 mg by mouth daily with breakfast.    [provider]    Family History Family History  Problem Relation Age of Onset  . Mental illness Mother   . Depression Father     Social History Social History   Tobacco Use  . Smoking status: Current Every Day Smoker    Packs/day: 0.25    Types: Cigarettes  . Smokeless tobacco: Never Used  Substance Use Topics  . Alcohol use: Yes    Frequency: Never    Comment: uses less than  once monthly  . Drug use: Yes    Types: Marijuana    Comment: last smoked a couple of days ago     Allergies   Folic acid; Prednisone; and Nickel   Review of Systems Review of Systems   Physical Exam Triage Vital Signs ED Triage Vitals  Enc Vitals Group     BP 11/21/17 1429 114/61     Pulse Rate 11/21/17 1429 88     Resp 11/21/17 1429 14     Temp 11/21/17 1429 97.6 F (36.4 C)     Temp Source 11/21/17 1429 Oral     SpO2 11/21/17 1429 100 %     Weight 11/21/17 1426 212 lb (96.2 kg)     Height 11/21/17 1426  (1.6 m)     Head Circumference --      Peak Flow --      Pain Score 11/21/17 1426 7     Pain Loc --      Pain Edu? --      Excl. in GC? --    No data found.  Updated Vital Signs BP 114/61 (BP Location: Left Arm)   Pulse 88   Temp 97.6 F (36.4 C) (Oral)   Resp 14   Ht  (1.6 m)   Wt 212 lb (96.2 kg)   SpO2 100%   BMI 37.55 kg/m   Visual Acuity Right Eye Distance:   Left Eye Distance:   Bilateral Distance:    Right Eye Near:   Left Eye Near:    Bilateral Near:     Physical Exam  Constitutional: She appears well-developed and well-nourished. No distress.  Musculoskeletal:       Left ankle: She exhibits swelling. She exhibits normal range of motion, no ecchymosis, no deformity, no laceration and normal pulse. Tenderness. Lateral malleolus and AITFL tenderness found. Achilles tendon normal.       Left foot: There is tenderness, bony tenderness and swelling. There is normal range of motion, normal capillary refill, no crepitus and no deformity.  Skin: She is not diaphoretic.  Nursing note and vitals reviewed.    UC Treatments / Results  Labs (all labs ordered are listed, but only abnormal results are displayed) Labs Reviewed - No data to display  EKG None  Radiology Dg Ankle Complete Left  Result Date: 11/21/2017 CLINICAL DATA:  Pain and ankle joints EXAM: LEFT ANKLE COMPLETE - 3+ VIEW COMPARISON:  None. FINDINGS: Ankle mortise  intact. The talar dome is normal. No malleolar fracture. The calcaneus is normal. No effusion or soft tissue abnormality. IMPRESSION: No osseous abnormality. Electronically Signed   By: Genevive Bi M.D.   On: 11/21/2017 15:02   Dg Foot Complete Left  Result  Date: 11/21/2017 CLINICAL DATA:  Pt fell off steps and twisted left foot/ankle. Pain and swelling along top of metatarsals and lateral malleolus EXAM: LEFT FOOT - COMPLETE 3+ VIEW COMPARISON:  None. FINDINGS: No fracture or dislocation of mid foot or forefoot. Thickening of the fifth proximal phalanx along the midshaft suggests remote fracture. The calcaneus is normal. No soft tissue abnormality. IMPRESSION: No acute osseous abnormality. Electronically Signed   By: Genevive Bi M.D.   On: 11/21/2017 15:05    Procedures Procedures (including critical care time)  Medications Ordered in UC Medications - No data to display  Initial Impression / Assessment and Plan / UC Course  I have reviewed the triage vital signs and the nursing notes.  Pertinent labs & imaging results that were available during my care of the patient were reviewed by me and considered in my medical decision making (see chart for details).      Final Clinical Impressions(s) / UC Diagnoses   Final diagnoses:  Sprain of left ankle, unspecified ligament, initial encounter    ED Prescriptions    Medication Sig Dispense Auth. Provider   HYDROcodone-acetaminophen (NORCO/VICODIN) 5-325 MG tablet 1-2 tabs po qd 5 tablet Payton Mccallum, MD      1.x-ray results and diagnosis reviewed with patient 2. rx as per orders above; reviewed possible side effects, interactions, risks and benefits  3. Recommend supportive treatment with rest, ice, elevation 4. Follow-up prn if symptoms worsen or don't improve  Controlled Substance Prescriptions [place] Controlled Substance Registry consulted? Not Applicable   Payton Mccallum, MD 11/21/17 [phone]

## 2018-04-25 IMAGING — CR DG CHEST 2V
2 series · 2 of 2 positions shown · non-contrast
Comparison: 11/27/2015

CLINICAL DATA: Cough for 1 week

EXAM:
CHEST  2 VIEW

[chest pa]
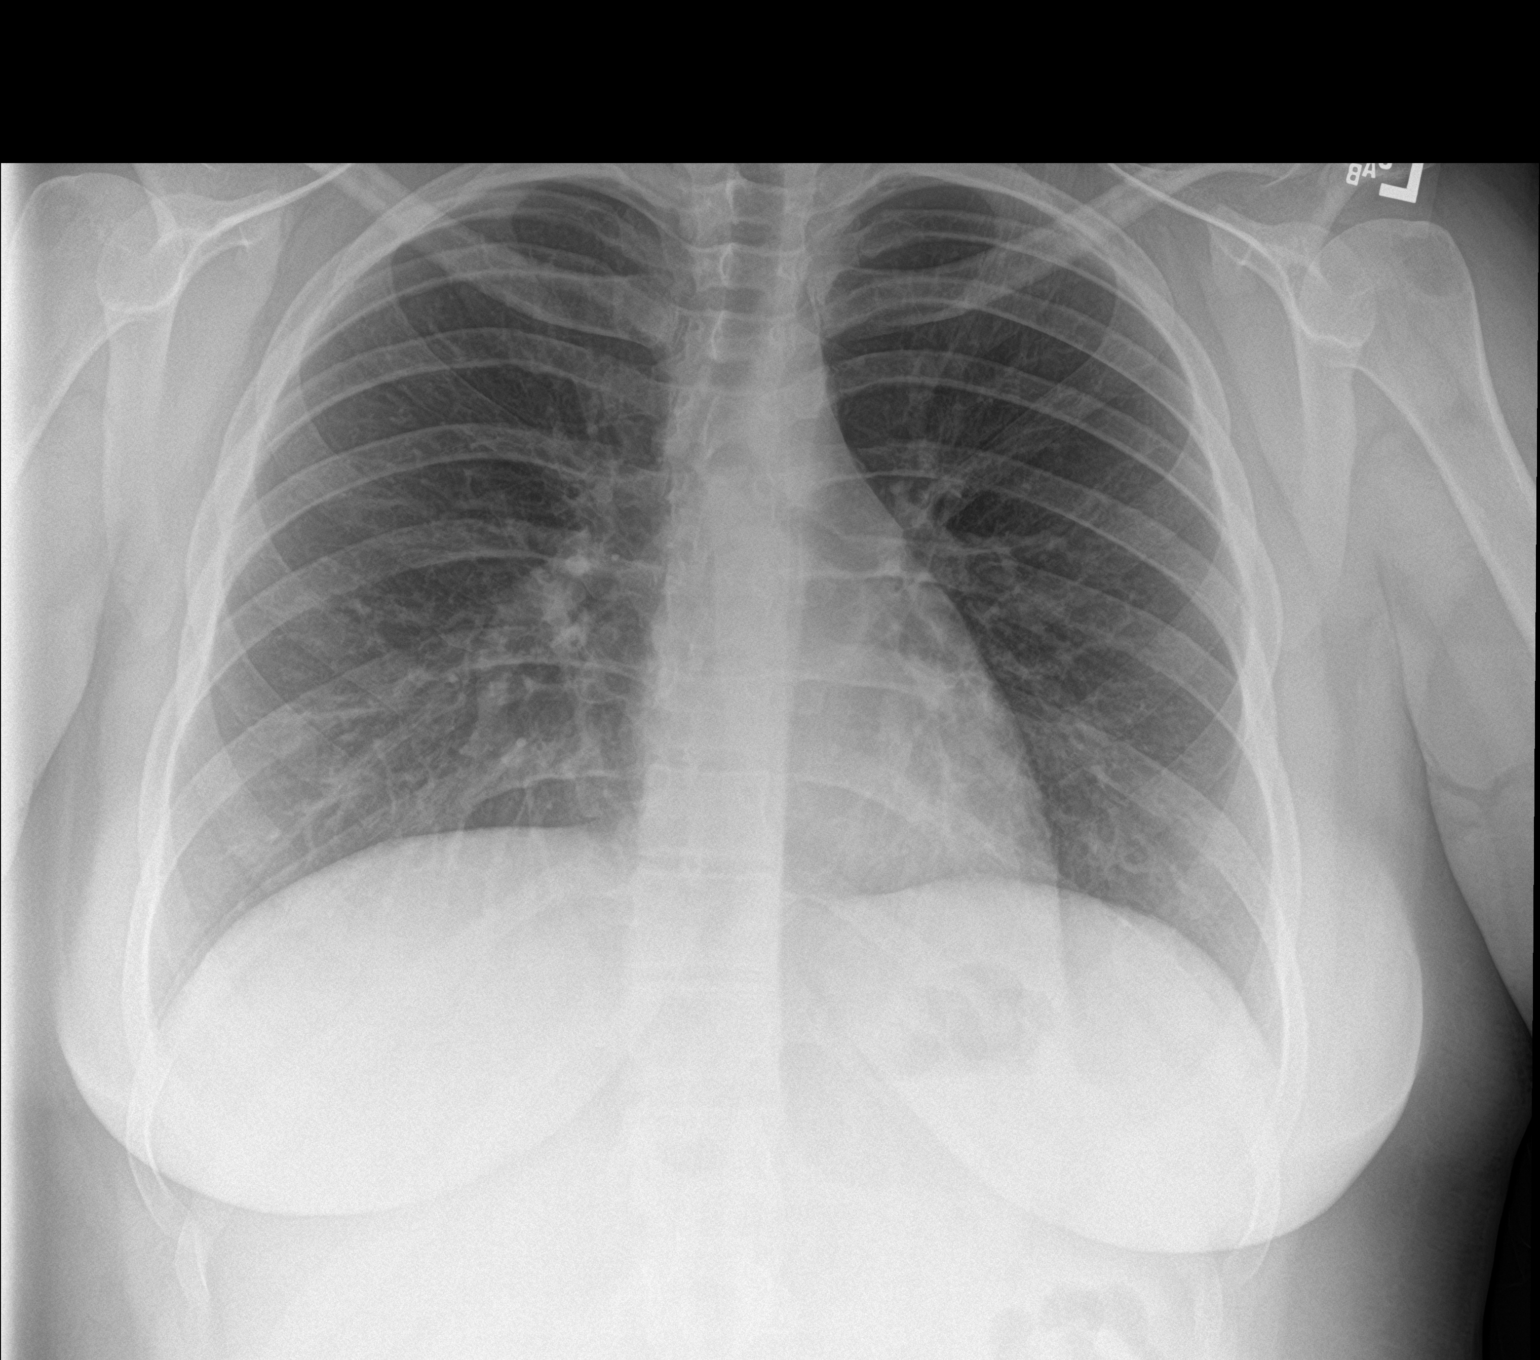

[chest lat]
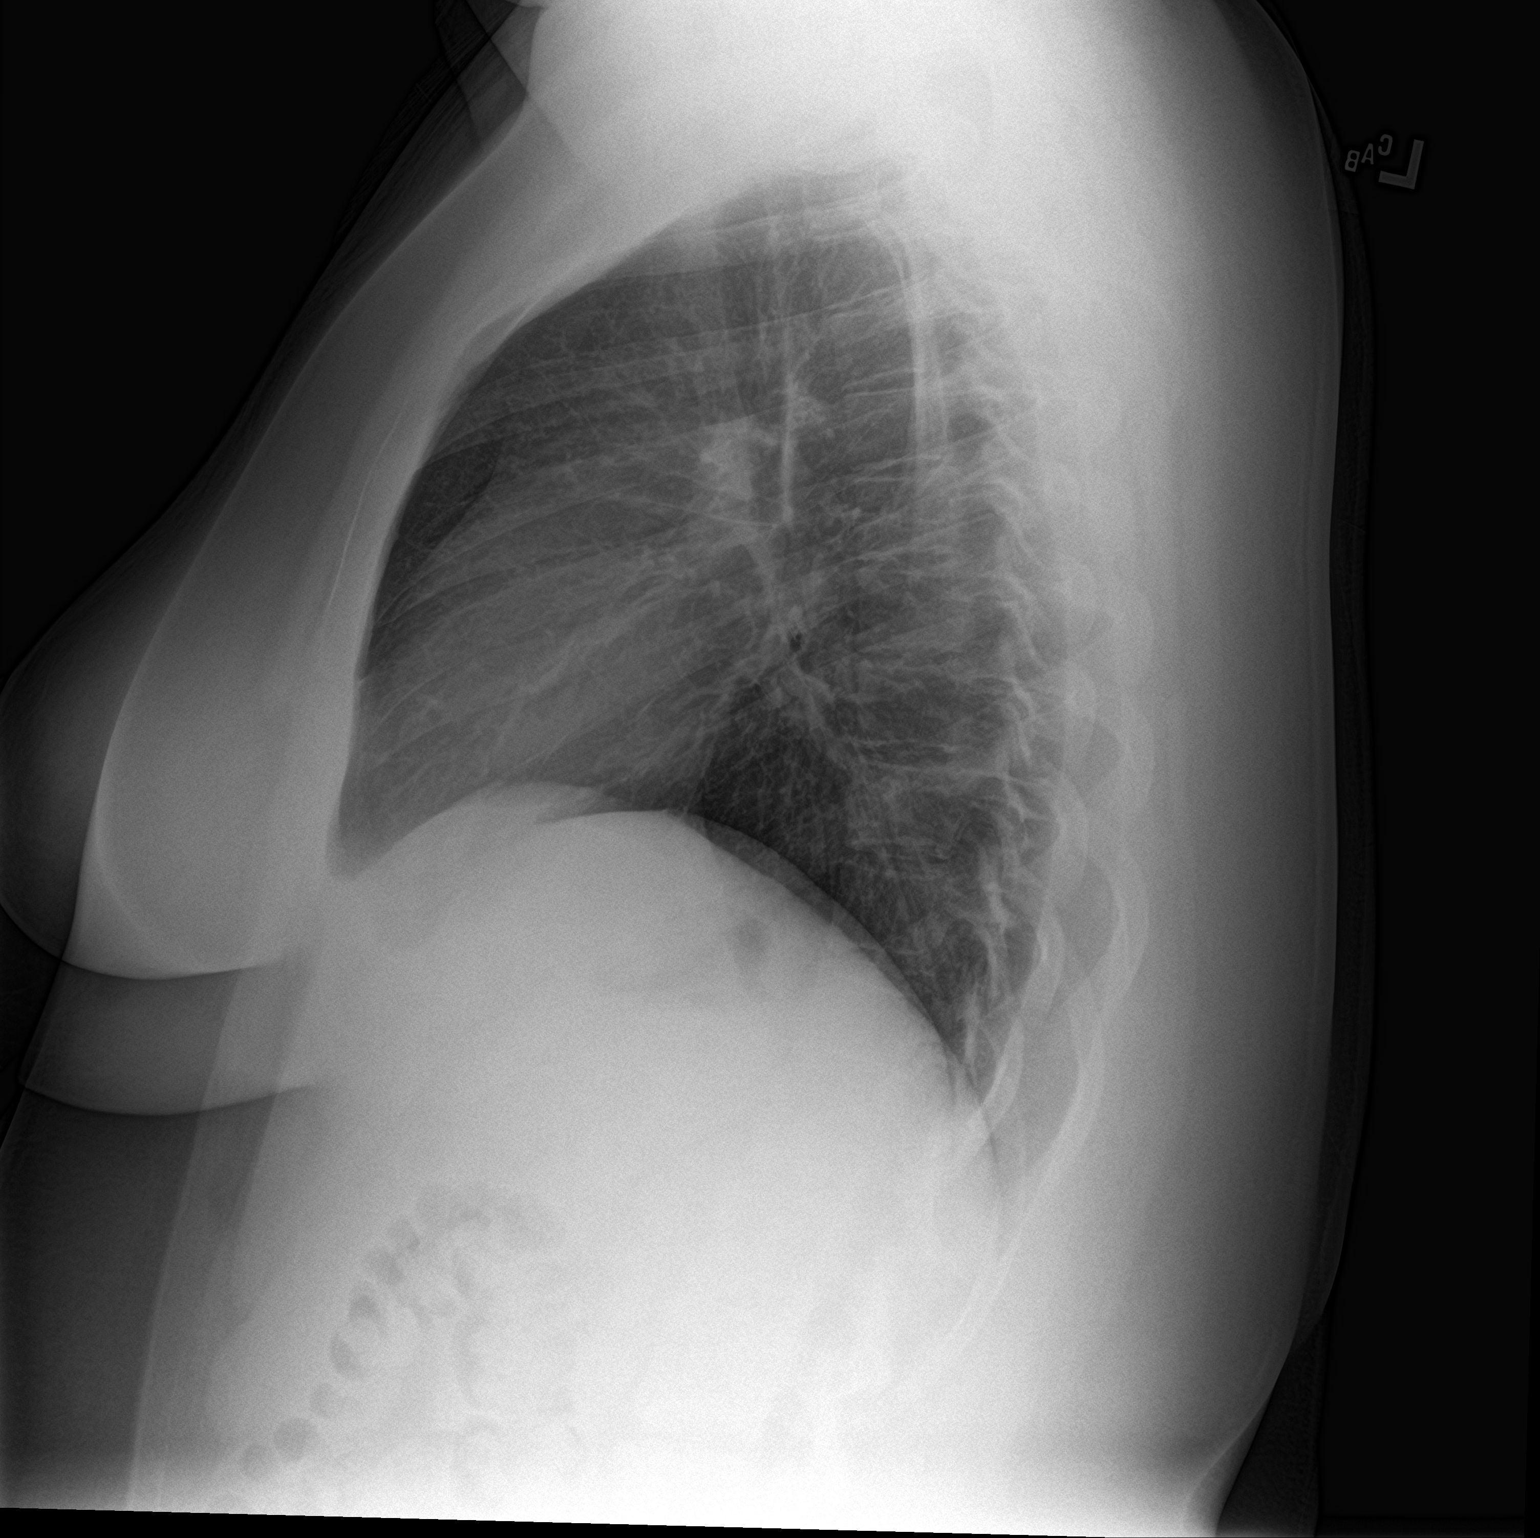

[2 of 2 positions shown; findings below may reference images not displayed]

FINDINGS: The heart size and mediastinal contours are within normal limits.
Both lungs are clear. The visualized skeletal structures are
unremarkable.
IMPRESSION: No active cardiopulmonary disease.

## 2018-04-28 ENCOUNTER — Encounter: Payer: Self-pay | Admitting: Emergency Medicine

## 2018-04-28 ENCOUNTER — Emergency Department
Admission: EM | Admit: 2018-04-28 | Discharge: 2018-04-28 | Disposition: A | Discharge status: Home / Self Care | Payer: Medicaid Other | Attending: Student in an Organized Health Care Education/Training Program | Admitting: Student in an Organized Health Care Education/Training Program

## 2018-04-28 DIAGNOSIS — N939 Abnormal uterine and vaginal bleeding, unspecified: Secondary | ICD-10-CM

## 2018-04-28 DIAGNOSIS — Z79899 Other long term (current) drug therapy: Secondary | ICD-10-CM | POA: Diagnosis not present

## 2018-04-28 DIAGNOSIS — F17210 Nicotine dependence, cigarettes, uncomplicated: Secondary | ICD-10-CM | POA: Diagnosis not present

## 2018-04-28 DIAGNOSIS — J45909 Unspecified asthma, uncomplicated: Secondary | ICD-10-CM | POA: Diagnosis not present

## 2018-04-28 LAB — CBC WITH DIFFERENTIAL/PLATELET
Abs Immature Granulocytes: 0.05 10*3/uL (ref 0.00–0.07)
Basophils Absolute: 0 10*3/uL (ref 0.0–0.1)
Basophils Relative: 0 %
EOS PCT: 3 %
Eosinophils Absolute: 0.3 10*3/uL (ref 0.0–0.5)
HEMATOCRIT: 42.2 % (ref 36.0–46.0)
HEMOGLOBIN: 14.5 g/dL (ref 12.0–15.0)
Immature Granulocytes: 1 %
LYMPHS ABS: 1.7 10*3/uL (ref 0.7–4.0)
Lymphocytes Relative: 18 %
MCH: 31.9 pg (ref 26.0–34.0)
MCHC: 34.4 g/dL (ref 30.0–36.0)
MCV: 92.7 fL (ref 80.0–100.0)
MONO ABS: 0.6 10*3/uL (ref 0.1–1.0)
MONOS PCT: 6 %
Neutro Abs: 6.7 10*3/uL (ref 1.7–7.7)
Neutrophils Relative %: 72 %
Platelets: 400 10*3/uL (ref 150–400)
RBC: 4.55 MIL/uL (ref 3.87–5.11)
RDW: 13 % (ref 11.5–15.5)
WBC: 9.5 10*3/uL (ref 4.0–10.5)
nRBC: 0 % (ref 0.0–0.2)

## 2018-04-28 LAB — HCG, QUANTITATIVE, PREGNANCY: hCG, Beta Chain, Quant, S: <1 m[IU]/mL (ref <5)

## 2018-04-28 MED ORDER — IBUPROFEN 400 MG PO TABS
600.0000 mg | ORAL_TABLET | Freq: Once | ORAL | Status: DC
  Filled 2018-04-28: qty 2

## 2018-04-28 MED ORDER — IBUPROFEN 800 MG PO TABS
800.0000 mg | ORAL_TABLET | Freq: Once | ORAL | Status: AC
  Administered 2018-04-28: 800 mg via ORAL

## 2018-04-28 NOTE — ED Triage Notes (Signed)
PT arrived with concerns over miscarriage. Pt states she took a negative pregnancy test 2 months prior but states she has only had spotting for the last 2 months. Pt states today she has a large sudden loss of blood/clots.

## 2018-04-28 NOTE — ED Provider Notes (Signed)
[facility] Emergency Department Provider Note    First MD Initiated Contact with Patient 04/28/18 1429     (approximate)  I have reviewed the triage vital signs and the nursing notes.   HISTORY  Chief Complaint Vaginal Bleeding    HPI Morgan Meadows is a 22 y.o. female the ER because she is concerned that she is having a miscarriage.  States that she started having heavy vaginal bleeding starting last night.  States that she is been going through 1 tampon per hour.  Denies any chest pain shortness of breath or fainting spells.  States her last normal menstrual cycle was over 2 months ago and since then has had irregular spotting.  States that last night she was having crampy abdominal pain.  States that she took a pregnancy test 2 months ago and it was negative but did not get additional testing or repeat repeat testing.  She is not currently on any blood thinner.  Not on any birth control.  She does smoke occasionally.    Past Medical History:  Diagnosis Date  . ADHD (attention deficit hyperactivity disorder)   . Asthma   . Depression    Family History  Problem Relation Age of Onset  . Mental illness Mother   . Depression Father    Past Surgical History:  Procedure Laterality Date  . TONSILLECTOMY AND ADENOIDECTOMY  Age 15  . TOOTH EXTRACTION  age 58  . TYMPANOPLASTY     Patient Active Problem List   Diagnosis Date Noted  . Suicidal ideation 10/03/2016  . Severe recurrent major depression without psychotic features (HCC) 10/02/2016  . MDD (major depressive disorder), recurrent episode, moderate (HCC) 05/15/2016  . Cannabis use disorder, moderate, dependence (HCC) 05/15/2016  . Methamphetamine use disorder, moderate (HCC) 05/15/2016  . Tobacco use disorder 05/14/2016  . Borderline personality disorder (HCC) 05/14/2016  . PTSD (post-traumatic stress disorder) 05/13/2016  . Suicide attempt (HCC) 09/26/2011      Prior to Admission  medications   Medication Sig Start Date End Date Taking? Authorizing Provider  albuterol (PROVENTIL HFA;VENTOLIN HFA) 108 (90 Base) MCG/ACT inhaler Inhale 1-2 puffs into the lungs every 6 (six) hours as needed for wheezing or shortness of breath. 09/06/16   Payton Mccallum, MD  azithromycin (ZITHROMAX Z-PAK) 250 MG tablet Take 2 tablets on day 1 and 1 tablet the next 4 days 08/12/17   Anson [place], PA-C  benzonatate (TESSALON) 100 MG capsule Take 1 capsule (100 mg total) by mouth every 8 (eight) hours. 08/12/17   Anson [place], PA-C  cabergoline (DOSTINEX) 0.5 MG tablet Take by mouth. 09/14/17   [provider]  cetirizine (ZYRTEC) 10 MG tablet Take 1 tablet by mouth daily. 11/13/16   [provider]  etonogestrel (NEXPLANON) 68 MG IMPL implant  08/20/17   [provider]  guaiFENesin (ROBITUSSIN) 100 MG/5ML liquid Take 5-10 mLs (100-200 mg total) by mouth at bedtime as needed for cough. 08/12/17   Anson [place], PA-C  guaiFENesin-codeine 100-10 MG/5ML syrup Take 10 mLs by mouth 3 (three) times daily as needed. 08/16/17   Triplett, Rulon Eisenmenger B, FNP  HYDROcodone-acetaminophen (NORCO/VICODIN) 5-325 MG tablet 1-2 tabs po qd 11/21/17   Payton Mccallum, MD  ondansetron (ZOFRAN ODT) 8 MG disintegrating tablet Take 1 tablet (8 mg total) by mouth 2 (two) times daily. 12/24/16   Lutricia Feil, PA-C  oseltamivir (TAMIFLU) 75 MG capsule Take 1 capsule (75 mg total) by mouth 2 (two) times daily.  08/14/17   Payton Mccallum, MD  predniSONE (DELTASONE) 10 MG tablet 50 mg (5 tablets) daily x 3 days, then 40 mg (4 tablets) daily x 3 days, then 30 mg (3 tablets) daily x 3 days, then 20 mg (2 tablets) daily x 3 days, then 10 mg (1 tablet) daily x 3 days. 06/15/17   Tommie Sams, DO  traZODone (DESYREL) 50 MG tablet Take 1 tablet (50 mg total) by mouth at bedtime. Patient taking differently: Take 150 mg by mouth at bedtime.  10/06/16   Pucilowska, Ellin Goodie, MD  UNABLE TO FIND L-Mythel  Folate 15mg  daily    [provider]  venlafaxine XR (EFFEXOR-XR) 150 MG 24 hr capsule Take 150 mg by mouth daily with breakfast.    [provider]    Allergies Folic acid; Prednisone; and Nickel    Social History Social History   Tobacco Use  . Smoking status: Current Every Day Smoker    Packs/day: 0.25    Types: Cigarettes  . Smokeless tobacco: Never Used  Substance Use Topics  . Alcohol use: Yes    Frequency: Never    Comment: uses less than once monthly  . Drug use: Yes    Types: Marijuana    Comment: last smoked a couple of days ago    Review of Systems Patient denies headaches, rhinorrhea, blurry vision, numbness, shortness of breath, chest pain, edema, cough, abdominal pain, nausea, vomiting, diarrhea, dysuria, fevers, rashes or hallucinations unless otherwise stated above in HPI. ____________________________________________   PHYSICAL EXAM:  VITAL SIGNS: Vitals:   04/28/18 1345  BP: 129/69  Pulse: 97  Resp: 18  Temp: 98.3 F (36.8 C)  SpO2: 98%    Constitutional: Alert and oriented.  Eyes: Conjunctivae are normal.  Head: Atraumatic. Nose: No congestion/rhinnorhea. Mouth/Throat: Mucous membranes are moist.   Neck: No stridor. Painless ROM.  Cardiovascular: Normal rate, regular rhythm. Grossly normal heart sounds.  Good peripheral circulation. Respiratory: Normal respiratory effort.  No retractions. Lungs CTAB. Gastrointestinal: Soft and nontender. No distention. No abdominal bruits. No CVA tenderness. Genitourinary: deferred Musculoskeletal: No lower extremity tenderness nor edema.  No joint effusions. Neurologic:  Normal speech and language. No gross focal neurologic deficits are appreciated. No facial droop Skin:  Skin is warm, dry and intact. No rash noted. Psychiatric: Mood and affect are normal. Speech and behavior are normal.  ____________________________________________   LABS (all labs ordered are listed, but only  abnormal results are displayed)  Results for orders placed or performed during the hospital encounter of 04/28/18 (from the past 24 hour(s))  hCG, quantitative, pregnancy     Status: None   Collection Time: 04/28/18  1:49 PM  Result Value Ref Range   hCG, Beta Chain, Quant, S <1 <5 mIU/mL  CBC with Differential     Status: None   Collection Time: 04/28/18  1:49 PM  Result Value Ref Range   WBC 9.5 4.0 - 10.5 K/uL   RBC 4.55 3.87 - 5.11 MIL/uL   Hemoglobin 14.5 12.0 - 15.0 g/dL   HCT 16.1 09.6 - 04.5 %   MCV 92.7 80.0 - 100.0 fL   MCH 31.9 26.0 - 34.0 pg   MCHC 34.4 30.0 - 36.0 g/dL   RDW 40.9 81.1 - 91.4 %   Platelets 400 150 - 400 K/uL   nRBC 0.0 0.0 - 0.2 %   Neutrophils Relative % 72 %   Neutro Abs 6.7 1.7 - 7.7 K/uL   Lymphocytes Relative 18 %  Lymphs Abs 1.7 0.7 - 4.0 K/uL   Monocytes Relative 6 %   Monocytes Absolute 0.6 0.1 - 1.0 K/uL   Eosinophils Relative 3 %   Eosinophils Absolute 0.3 0.0 - 0.5 K/uL   Basophils Relative 0 %   Basophils Absolute 0.0 0.0 - 0.1 K/uL   Immature Granulocytes 1 %   Abs Immature Granulocytes 0.05 0.00 - 0.07 K/uL   ____________________________________________ ____________________________________________  RADIOLOGY   ____________________________________________   PROCEDURES  Procedure(s) performed:  Procedures    Critical Care performed: no ____________________________________________   INITIAL IMPRESSION / ASSESSMENT AND PLAN / ED COURSE  Pertinent labs & imaging results that were available during my care of the patient were reviewed by me and considered in my medical decision making (see chart for details).   DDX: Ectopic, AB, DOB, miscarriage, cervicitis  Braniya Farrugia Rosenkranz is a 22 y.o. who presents to the ED with symptoms as described above.  Patient is AFVSS in ED. Exam as above. Given current presentation have considered the above differential.  She is not pregnant and hemoglobin is stable.  No signs or symptoms of  acute blood loss anemia.  Not consistent with ectopic.  Doubt miscarriage.  Did recommend pelvic exam but patient declined this preferring to see her OB/GYN for follow-up.  As her abdominal exam is soft and benign I believe this is reasonable seems clinically most consistent with an ovulatory cycle.  Discussed with options with birth control, Lysteda or high-dose Motrin.  No she is smoking and does have a high risk of blood clot formation and has been unable to tolerate birth control in the past will defer decision to OB/GYN.  States that the bleeding is already starting to subside and is mild this time.  Have discussed with the patient and available family all diagnostics and treatments performed thus far and all questions were answered to the best of my ability. The patient demonstrates understanding and agreement with plan.       As part of my medical decision making, I reviewed the following data within the electronic MEDICAL RECORD NUMBER Nursing notes reviewed and incorporated, Labs reviewed, notes from prior ED visits.   ____________________________________________   FINAL CLINICAL IMPRESSION(S) / ED DIAGNOSES  Final diagnoses:  Vaginal bleeding      NEW MEDICATIONS STARTED DURING THIS VISIT:  New Prescriptions   No medications on file     Note:  This document was prepared using Dragon voice recognition software and may include unintentional dictation errors.    Willy Eddy, MD 04/28/18 [phone]

## 2019-05-30 ENCOUNTER — Ambulatory Visit
Admission: EM | Admit: 2019-05-30 | Discharge: 2019-05-30 | Disposition: A | Discharge status: Home / Self Care | Payer: Medicaid Other | Attending: Family Medicine | Admitting: Family Medicine

## 2019-05-30 ENCOUNTER — Other Ambulatory Visit: Payer: Self-pay

## 2019-05-30 ENCOUNTER — Encounter: Payer: Self-pay | Admitting: Emergency Medicine

## 2019-05-30 DIAGNOSIS — R112 Nausea with vomiting, unspecified: Secondary | ICD-10-CM | POA: Insufficient documentation

## 2019-05-30 DIAGNOSIS — Z113 Encounter for screening for infections with a predominantly sexual mode of transmission: Secondary | ICD-10-CM | POA: Diagnosis not present

## 2019-05-30 DIAGNOSIS — Z3202 Encounter for pregnancy test, result negative: Secondary | ICD-10-CM

## 2019-05-30 DIAGNOSIS — N760 Acute vaginitis: Secondary | ICD-10-CM | POA: Diagnosis not present

## 2019-05-30 DIAGNOSIS — N912 Amenorrhea, unspecified: Secondary | ICD-10-CM

## 2019-05-30 DIAGNOSIS — B9689 Other specified bacterial agents as the cause of diseases classified elsewhere: Secondary | ICD-10-CM

## 2019-05-30 LAB — URINALYSIS, COMPLETE (UACMP) WITH MICROSCOPIC
Bilirubin Urine: NEGATIVE
Glucose, UA: NEGATIVE mg/dL
Hgb urine dipstick: NEGATIVE
Ketones, ur: NEGATIVE mg/dL
Leukocytes,Ua: NEGATIVE
Nitrite: NEGATIVE
Protein, ur: NEGATIVE mg/dL
RBC / HPF: NONE SEEN RBC/hpf (ref 0–5)
Specific Gravity, Urine: 1.02 (ref 1.005–1.030)
pH: 8 (ref 5.0–8.0)

## 2019-05-30 LAB — WET PREP, GENITAL
Sperm: NONE SEEN
Trich, Wet Prep: NONE SEEN
Yeast Wet Prep HPF POC: NONE SEEN

## 2019-05-30 LAB — PREGNANCY, URINE: Preg Test, Ur: NEGATIVE

## 2019-05-30 LAB — HCG, QUANTITATIVE, PREGNANCY: hCG, Beta Chain, Quant, S: <1 m[IU]/mL (ref <5)

## 2019-05-30 MED ORDER — ONDANSETRON HCL 4 MG PO TABS: 4.0000 mg | ORAL_TABLET | Freq: Three times a day (TID) | ORAL | 0 refills | Status: DC | PRN

## 2019-05-30 MED ORDER — METRONIDAZOLE 500 MG PO TABS: 500.0000 mg | ORAL_TABLET | Freq: Two times a day (BID) | ORAL | 0 refills | Status: DC

## 2019-05-30 NOTE — ED Provider Notes (Signed)
[facility]    CSN: [id number] Arrival date & time: 05/30/19  1328  History   Chief Complaint Chief Complaint  Patient presents with  . Exposure to STD  . Amenorrhea   HPI  23 year old female presents with multiple complaints.  Patient reports that she has not had a menstrual cycle for the past 2-1/2 months.  Patient is concerned that she may be pregnant.  She has taken several home pregnancy tests which have been negative.  She is requesting a pregnancy test today.  Patient states that she has had some nausea and vomiting that occurs at random.  She is concerned that this may be related to pregnancy.  She is sexually active.  Patient states that she desires screening for STDs.  Additionally, patient states that she has had some ongoing urinary symptoms for the past 2 to 3 weeks.  She states that she has been peeing more frequently.  She is concerned that she may have a UTI.  She took Azo with improvement in her urinary symptoms.  No other reported symptoms.   PMH, Surgical Hx, Family Hx, Social History reviewed and updated as below.  Past Medical History:  Diagnosis Date  . ADHD (attention deficit hyperactivity disorder)   . Asthma   . Depression     Patient Active Problem List   Diagnosis Date Noted  . Suicidal ideation 10/03/2016  . Severe recurrent major depression without psychotic features (HCC) 10/02/2016  . MDD (major depressive disorder), recurrent episode, moderate (HCC) 05/15/2016  . Cannabis use disorder, moderate, dependence (HCC) 05/15/2016  . Methamphetamine use disorder, moderate (HCC) 05/15/2016  . Tobacco use disorder 05/14/2016  . Borderline personality disorder (HCC) 05/14/2016  . PTSD (post-traumatic stress disorder) 05/13/2016  . Suicide attempt (HCC) 09/26/2011    Past Surgical History:  Procedure Laterality Date  . TONSILLECTOMY AND ADENOIDECTOMY  Age 23  . TOOTH EXTRACTION  age 23  . TYMPANOPLASTY      OB History   No obstetric  history on file.      Home Medications    Prior to Admission medications   Medication Sig Start Date End Date Taking? Authorizing Provider  albuterol (PROVENTIL HFA;VENTOLIN HFA) 108 (90 Base) MCG/ACT inhaler Inhale 1-2 puffs into the lungs every 6 (six) hours as needed for wheezing or shortness of breath. 09/06/16  Yes Payton Mccallumonty, [place], MD  metroNIDAZOLE (FLAGYL) 500 MG tablet Take 1 tablet (500 mg total) by mouth 2 (two) times daily. 05/30/19   Tommie Samsook, Symphony Demuro G, DO  ondansetron (ZOFRAN) 4 MG tablet Take 1 tablet (4 mg total) by mouth every 8 (eight) hours as needed for nausea or vomiting. 05/30/19   Tommie Samsook, [place] Janus G, DO  UNABLE TO FIND L-Mythel Folate 15mg  daily    [provider]  etonogestrel (NEXPLANON) 68 MG IMPL implant  08/20/17 05/30/19  [provider]  traZODone (DESYREL) 50 MG tablet Take 1 tablet (50 mg total) by mouth at bedtime. Patient taking differently: Take 150 mg by mouth at bedtime.  10/06/16 05/30/19  Pucilowska, Ellin GoodieJolanta B, MD  venlafaxine XR (EFFEXOR-XR) 150 MG 24 hr capsule Take 150 mg by mouth daily with breakfast.  05/30/19  [provider]    Family History Family History  Problem Relation Age of Onset  . Mental illness Mother   . Depression Father     Social History Social History   Tobacco Use  . Smoking status: Current Every Day Smoker    Packs/day: 0.25    Types: Cigarettes  .  Smokeless tobacco: Never Used  Substance Use Topics  . Alcohol use: Yes    Frequency: Never    Comment: uses less than once monthly  . Drug use: Not Currently    Types: Marijuana    Comment: last smoked a couple of days ago     Allergies   Folic acid, Prednisone, and Nickel   Review of Systems Review of Systems  Constitutional: Negative.   Gastrointestinal: Positive for nausea and vomiting.  Genitourinary: Positive for frequency and menstrual problem.   Physical Exam Triage Vital Signs ED Triage Vitals  Enc Vitals Group     BP 05/30/19 1346  109/63     Pulse Rate 05/30/19 1346 94     Resp 05/30/19 1346 18     Temp 05/30/19 1346 98.4 F (36.9 C)     Temp Source 05/30/19 1346 Oral     SpO2 05/30/19 1346 98 %     Weight 05/30/19 1341 214 lb (97.1 kg)     Height 05/30/19 1341 5\' 3"  (1.6 m)     Head Circumference --      Peak Flow --      Pain Score 05/30/19 1341 0     Pain Loc --      Pain Edu? --      Excl. in [place]? --    Updated Vital Signs BP 109/63 (BP Location: Left Arm)   Pulse 94   Temp 98.4 F (36.9 C) (Oral)   Resp 18   Ht 5\' 3"  (1.6 m)   Wt 97.1 kg   LMP 03/30/2019 (Approximate)   SpO2 98%   BMI 37.91 kg/m   Visual Acuity Right Eye Distance:   Left Eye Distance:   Bilateral Distance:    Right Eye Near:   Left Eye Near:    Bilateral Near:     Physical Exam Constitutional:      General: She is not in acute distress.    Appearance: Normal appearance. She is obese. She is not ill-appearing.  HENT:     Head: Normocephalic and atraumatic.  Eyes:     General:        Right eye: No discharge.        Left eye: No discharge.     Conjunctiva/sclera: Conjunctivae normal.  Cardiovascular:     Rate and Rhythm: Normal rate and regular rhythm.     Heart sounds: No murmur.  Pulmonary:     Effort: Pulmonary effort is normal.     Breath sounds: Normal breath sounds. No wheezing, rhonchi or rales.  Abdominal:     General: There is no distension.     Palpations: Abdomen is soft.     Tenderness: There is no abdominal tenderness.  Skin:    General: Skin is warm.     Findings: No rash.  Neurological:     Mental Status: She is alert.  Psychiatric:        Mood and Affect: Mood normal.        Behavior: Behavior normal.    UC Treatments / Results  Labs (all labs ordered are listed, but only abnormal results are displayed) Labs Reviewed  WET PREP, GENITAL - Abnormal; Notable for the following components:      Result Value   Clue Cells Wet Prep HPF POC PRESENT (*)    WBC, Wet Prep HPF POC FEW (*)    All  other components within normal limits  URINALYSIS, COMPLETE (UACMP) WITH MICROSCOPIC - Abnormal; Notable for the  following components:   APPearance HAZY (*)    Bacteria, UA RARE (*)    All other components within normal limits  GC/CHLAMYDIA PROBE AMP  PREGNANCY, URINE  HCG, QUANTITATIVE, PREGNANCY    EKG   Radiology No results found.  Procedures Procedures (including critical care time)  Medications Ordered in UC Medications - No data to display  Initial Impression / Assessment and Plan / UC Course  I have reviewed the triage vital signs and the nursing notes.  Pertinent labs & imaging results that were available during my care of the patient were reviewed by me and considered in my medical decision making (see chart for details).    23 year old female presents with multiple complaints.  Awaiting GC chlamydia result.  Wet prep obtained and revealed bacterial vaginosis.  Placing on Flagyl.  Regarding her amenorrhea, both urine and serum pregnancy test were negative.  Zofran as needed for nausea & vomiting.  Advised to see OB/GYN given amenorrhea with negative pregnancy test.  Final Clinical Impressions(s) / UC Diagnoses   Final diagnoses:  Screen for STD (sexually transmitted disease)  Nausea and vomiting, intractability of vomiting not specified, unspecified vomiting type  Amenorrhea  Bacterial vaginosis     Discharge Instructions     Medication as directed.  Pregnancy test negative.  Take care  Dr. Adriana Simas    ED Prescriptions    Medication Sig Dispense Auth. Provider   ondansetron (ZOFRAN) 4 MG tablet Take 1 tablet (4 mg total) by mouth every 8 (eight) hours as needed for nausea or vomiting. 20 tablet Ami Thornsberry G, DO   metroNIDAZOLE (FLAGYL) 500 MG tablet Take 1 tablet (500 mg total) by mouth 2 (two) times daily. 14 tablet Tommie Sams, DO     PDMP not reviewed this encounter.   Tommie Sams, [place] 05/30/19 1650

## 2019-05-30 NOTE — Discharge Instructions (Signed)
Medication as directed.  Pregnancy test negative.  Take care  Dr. Lacinda Axon

## 2019-05-30 NOTE — ED Triage Notes (Signed)
Pt presents to [facility] for amenorrhea and nausea. She states that her last period was 2 and a half months ago. She did several home pregnancy test and one came back negative but the rest came back invalid. She also wants to be checked for STD's/ she is not having STD symptoms.

## 2019-06-01 LAB — GC/CHLAMYDIA PROBE AMP
Chlamydia trachomatis, NAA: NEGATIVE
Neisseria Gonorrhoeae by PCR: POSITIVE — AB

## 2019-06-02 ENCOUNTER — Telehealth (HOSPITAL_COMMUNITY): Payer: Self-pay | Admitting: Emergency Medicine

## 2019-06-02 ENCOUNTER — Other Ambulatory Visit: Payer: Self-pay

## 2019-06-02 ENCOUNTER — Ambulatory Visit
Admission: EM | Admit: 2019-06-02 | Discharge: 2019-06-02 | Disposition: A | Discharge status: Home / Self Care | Payer: Medicaid Other | Attending: Family Medicine | Admitting: Family Medicine

## 2019-06-02 DIAGNOSIS — A549 Gonococcal infection, unspecified: Secondary | ICD-10-CM

## 2019-06-02 MED ORDER — AZITHROMYCIN 500 MG PO TABS
1000.0000 mg | ORAL_TABLET | Freq: Once | ORAL | Status: AC
  Administered 2019-06-02: 1000 mg via ORAL

## 2019-06-02 MED ORDER — CEFTRIAXONE SODIUM 250 MG IJ SOLR
250.0000 mg | Freq: Once | INTRAMUSCULAR | Status: AC
  Administered 2019-06-02: 250 mg via INTRAMUSCULAR

## 2019-06-02 NOTE — ED Provider Notes (Signed)
[facility]    CSN: [id number] Arrival date & time: 06/02/19  1649  History   Chief Complaint Chief Complaint  Patient presents with  . SEXUALLY TRANSMITTED DISEASE   HPI  23 year old female presents for treatment of gonorrhea.  Patient was recently seen on 11/9.  Has been experiencing amenorrhea as well as nausea, vomiting, and urinary frequency.  STD testing was done at that time.  Test returned positive for gonorrhea.  Patient notified today. Patient presents today for treatment.  Patient is visibly upset.  Patient states that she has 1 sexual partner and that she has been monogamous.  She believes that she contact her gonorrhea from her partner.  Desires treatment. Also desires additional STD testing. No other complaints or concerns at this time.  PMH, Surgical Hx, Family Hx, Social History reviewed and updated as below.  Past Medical History:  Diagnosis Date  . ADHD (attention deficit hyperactivity disorder)   . Asthma   . Depression    Patient Active Problem List   Diagnosis Date Noted  . Suicidal ideation 10/03/2016  . Severe recurrent major depression without psychotic features ([place]) 10/02/2016  . MDD (major depressive disorder), recurrent episode, moderate ([place]) 05/15/2016  . Cannabis use disorder, moderate, dependence ([place]) 05/15/2016  . Methamphetamine use disorder, moderate ([place]) 05/15/2016  . Tobacco use disorder 05/14/2016  . Borderline personality disorder ([place]) 05/14/2016  . PTSD (post-traumatic stress disorder) 05/13/2016  . Suicide attempt ([place]) 09/26/2011   Past Surgical History:  Procedure Laterality Date  . TONSILLECTOMY AND ADENOIDECTOMY  Age 17  . TOOTH EXTRACTION  age 12  . TYMPANOPLASTY     OB History   No obstetric history on file.    Home Medications    Prior to Admission medications   Medication Sig Start Date End Date Taking? Authorizing Provider  albuterol (PROVENTIL HFA;VENTOLIN HFA) 108 (90 Base) MCG/ACT inhaler Inhale  1-2 puffs into the lungs every 6 (six) hours as needed for wheezing or shortness of breath. 09/06/16   Norval Gable, MD  metroNIDAZOLE (FLAGYL) 500 MG tablet Take 1 tablet (500 mg total) by mouth 2 (two) times daily. 05/30/19   Coral Spikes, DO  ondansetron (ZOFRAN) 4 MG tablet Take 1 tablet (4 mg total) by mouth every 8 (eight) hours as needed for nausea or vomiting. 05/30/19   Coral Spikes, DO  UNABLE TO FIND L-Mythel Folate 15mg  daily    [provider]  etonogestrel (NEXPLANON) 68 MG IMPL implant  08/20/17 05/30/19  [provider]  traZODone (DESYREL) 50 MG tablet Take 1 tablet (50 mg total) by mouth at bedtime. Patient taking differently: Take 150 mg by mouth at bedtime.  10/06/16 05/30/19  Pucilowska, Wardell Honour, MD  venlafaxine XR (EFFEXOR-XR) 150 MG 24 hr capsule Take 150 mg by mouth daily with breakfast.  05/30/19  [provider]    Family History Family History  Problem Relation Age of Onset  . Mental illness Mother   . Depression Father     Social History Social History   Tobacco Use  . Smoking status: Current Every Day Smoker    Packs/day: 0.25    Types: Cigarettes  . Smokeless tobacco: Never Used  Substance Use Topics  . Alcohol use: Yes    Frequency: Never    Comment: uses less than once monthly  . Drug use: Not Currently    Types: Marijuana    Comment: last smoked a couple of days ago     Allergies  Folic acid, Prednisone, and Nickel   Review of Systems Review of Systems  Constitutional: Negative.   Gastrointestinal: Positive for nausea and vomiting.  Genitourinary: Positive for frequency.   Physical Exam Triage Vital Signs ED Triage Vitals  Enc Vitals Group     BP 06/02/19 1705 123/79     Pulse Rate 06/02/19 1705 (!) 108     Resp 06/02/19 1705 18     Temp 06/02/19 1705 98.6 F (37 C)     Temp Source 06/02/19 1705 Oral     SpO2 06/02/19 1705 99 %     Weight 06/02/19 1702 214 lb (97.1 kg)     Height --      Head  Circumference --      Peak Flow --      Pain Score 06/02/19 1749 0     Pain Loc --      Pain Edu? --      Excl. in [place]? --    Updated Vital Signs BP 123/79 (BP Location: Right Arm)   Pulse (!) 108   Temp 98.6 F (37 C) (Oral)   Resp 18   Wt 97.1 kg   LMP 03/02/2019 Comment: irregular period  SpO2 99%   BMI 37.91 kg/m   Visual Acuity Right Eye Distance:   Left Eye Distance:   Bilateral Distance:    Right Eye Near:   Left Eye Near:    Bilateral Near:     Physical Exam Vitals signs and nursing note reviewed.  Constitutional:      General: She is not in acute distress.    Appearance: Normal appearance. She is obese. She is not ill-appearing.  HENT:     Head: Normocephalic and atraumatic.  Eyes:     General:        Right eye: No discharge.        Left eye: No discharge.     Conjunctiva/sclera: Conjunctivae normal.  Pulmonary:     Effort: Pulmonary effort is normal. No respiratory distress.  Skin:    General: Skin is warm.     Findings: No rash.  Neurological:     Mental Status: She is alert.  Psychiatric:        Behavior: Behavior normal.     Comments: Flat affect. Depressed mood.    UC Treatments / Results  Labs (all labs ordered are listed, but only abnormal results are displayed) Labs Reviewed  RPR  HIV ANTIBODY (ROUTINE TESTING W REFLEX)    EKG   Radiology No results found.  Procedures Procedures (including critical care time)  Medications Ordered in UC Medications  cefTRIAXone (ROCEPHIN) injection 250 mg (250 mg Intramuscular Given 06/02/19 1737)  azithromycin (ZITHROMAX) tablet 1,000 mg (1,000 mg Oral Given 06/02/19 1736)    Initial Impression / Assessment and Plan / UC Course  I have reviewed the triage vital signs and the nursing notes.  Pertinent labs & imaging results that were available during my care of the patient were reviewed by me and considered in my medical decision making (see chart for details).    23 year old female  presents with gonorrhea.  New problem.  Rocephin and azithromycin given today.  Advised safe sex.  Patient is notifying her partner later today.  Awaiting HIV and syphilis screening.  Final Clinical Impressions(s) / UC Diagnoses   Final diagnoses:  Gonorrhea   Discharge Instructions   None    ED Prescriptions    None     PDMP not reviewed this encounter.  Tommie Sams, [place] 06/02/19 1814

## 2019-06-02 NOTE — ED Triage Notes (Signed)
Pt states she was told to come in today and get treated for gonorrhea.

## 2019-06-02 NOTE — Telephone Encounter (Signed)
Gonorrhea is positive.  Patient should return as soon as possible to the urgent care for treatment with IM rocephin 250mg  and po zithromax 1g. Patient will not need to see a provider unless there are new symptoms she would like evaluated. Pt needs education to refrain from sexual intercourse for now and for 7 days after treatment to give the medicine time to work. Sexual partners need to be notified and tested/treated. Condoms may reduce risk of reinfection. [facility] notified.   Patient contacted and made aware of    results. Pt verbalized understanding and had all questions answered.  Pt will return today for treatment.

## 2019-06-03 LAB — RPR: RPR Ser Ql: NONREACTIVE

## 2019-06-03 LAB — HIV ANTIBODY (ROUTINE TESTING W REFLEX): HIV Screen 4th Generation wRfx: NONREACTIVE

## 2019-07-18 ENCOUNTER — Other Ambulatory Visit: Payer: Self-pay

## 2019-07-18 DIAGNOSIS — Z5321 Procedure and treatment not carried out due to patient leaving prior to being seen by health care provider: Secondary | ICD-10-CM | POA: Insufficient documentation

## 2019-07-18 DIAGNOSIS — R1110 Vomiting, unspecified: Secondary | ICD-10-CM | POA: Insufficient documentation

## 2019-07-18 DIAGNOSIS — R109 Unspecified abdominal pain: Secondary | ICD-10-CM | POA: Insufficient documentation

## 2019-07-18 LAB — CBC
HCT: 45.4 % (ref 36.0–46.0)
Hemoglobin: 15.9 g/dL — ABNORMAL HIGH (ref 12.0–15.0)
MCH: 31.2 pg (ref 26.0–34.0)
MCHC: 35 g/dL (ref 30.0–36.0)
MCV: 89 fL (ref 80.0–100.0)
Platelets: 430 10*3/uL — ABNORMAL HIGH (ref 150–400)
RBC: 5.1 MIL/uL (ref 3.87–5.11)
RDW: 13.2 % (ref 11.5–15.5)
WBC: 11.5 10*3/uL — ABNORMAL HIGH (ref 4.0–10.5)
nRBC: 0 % (ref 0.0–0.2)

## 2019-07-18 LAB — URINALYSIS, COMPLETE (UACMP) WITH MICROSCOPIC
Bilirubin Urine: NEGATIVE
Glucose, UA: NEGATIVE mg/dL
Hgb urine dipstick: NEGATIVE
Ketones, ur: NEGATIVE mg/dL
Nitrite: NEGATIVE
Protein, ur: 30 mg/dL — AB
Specific Gravity, Urine: 1.024 (ref 1.005–1.030)
pH: 5 (ref 5.0–8.0)

## 2019-07-18 LAB — COMPREHENSIVE METABOLIC PANEL
ALT: 30 U/L (ref 0–44)
AST: 24 U/L (ref 15–41)
Albumin: 4.1 g/dL (ref 3.5–5.0)
Alkaline Phosphatase: 68 U/L (ref 38–126)
Anion gap: 12 (ref 5–15)
BUN: 10 mg/dL (ref 6–20)
CO2: 19 mmol/L — ABNORMAL LOW (ref 22–32)
Calcium: 9.5 mg/dL (ref 8.9–10.3)
Chloride: 106 mmol/L (ref 98–111)
Creatinine, Ser: 0.61 mg/dL (ref 0.44–1.00)
GFR calc Af Amer: >60 mL/min (ref >60)
GFR calc non Af Amer: >60 mL/min (ref >60)
Glucose, Bld: 83 mg/dL (ref 70–99)
Potassium: 4.1 mmol/L (ref 3.5–5.1)
Sodium: 137 mmol/L (ref 135–145)
Total Bilirubin: 0.8 mg/dL (ref 0.3–1.2)
Total Protein: 8.3 g/dL — ABNORMAL HIGH (ref 6.5–8.1)

## 2019-07-18 LAB — LIPASE, BLOOD: Lipase: 19 U/L (ref 11–51)

## 2019-07-18 LAB — POCT PREGNANCY, URINE: Preg Test, Ur: NEGATIVE

## 2019-07-18 NOTE — ED Triage Notes (Signed)
Patient c/o medial abdominal pain and multiple emeses beginning today. Patient has hx of stomach ulcers, but is not taking her medication at this time.

## 2019-07-19 ENCOUNTER — Ambulatory Visit
Admission: EM | Admit: 2019-07-19 | Discharge: 2019-07-19 | Disposition: A | Discharge status: Home / Self Care | Payer: Medicaid Other | Attending: Family Medicine | Admitting: Family Medicine

## 2019-07-19 ENCOUNTER — Emergency Department
Admission: EM | Admit: 2019-07-19 | Discharge: 2019-07-19 | Disposition: A | Discharge status: Home / Self Care | Payer: Medicaid Other | Attending: Emergency Medicine | Admitting: Emergency Medicine

## 2019-07-19 ENCOUNTER — Encounter: Payer: Self-pay | Admitting: Emergency Medicine

## 2019-07-19 ENCOUNTER — Other Ambulatory Visit: Payer: Self-pay

## 2019-07-19 DIAGNOSIS — R112 Nausea with vomiting, unspecified: Secondary | ICD-10-CM | POA: Diagnosis not present

## 2019-07-19 DIAGNOSIS — R1084 Generalized abdominal pain: Secondary | ICD-10-CM | POA: Diagnosis not present

## 2019-07-19 MED ORDER — ONDANSETRON 4 MG PO TBDP: 4.0000 mg | ORAL_TABLET | Freq: Three times a day (TID) | ORAL | 0 refills | Status: DC | PRN

## 2019-07-19 NOTE — ED Notes (Signed)
Called several times from lobby with no answer 

## 2019-07-19 NOTE — ED Notes (Signed)
No answer when called several times from lobby 

## 2019-07-19 NOTE — ED Provider Notes (Signed)
[facility]    CSN: [id number] Arrival date & time: 07/19/19  1055      History   Chief Complaint Chief Complaint  Patient presents with  . Abdominal Pain   HPI 23 year old female presents with lower abdominal pain, nausea, vomiting.  Symptoms started last night.  She went to the ER and had laboratory studies drawn as well as urine sample.  She left without being seen.  Patient presents today reporting continued abdominal pain.  No additional nausea and vomiting.  No diarrhea.  Pain 4/10 in severity.  No known inciting factor.  No known exacerbating relieving factors.  No fever.  No other associated symptoms.  No other complaints.   PMH, Surgical Hx, Family Hx, Social History reviewed and updated as below.  Past Medical History:  Diagnosis Date  . ADHD (attention deficit hyperactivity disorder)   . Asthma   . Depression     Patient Active Problem List   Diagnosis Date Noted  . Suicidal ideation 10/03/2016  . Severe recurrent major depression without psychotic features (HCC) 10/02/2016  . MDD (major depressive disorder), recurrent episode, moderate (HCC) 05/15/2016  . Cannabis use disorder, moderate, dependence (HCC) 05/15/2016  . Methamphetamine use disorder, moderate (HCC) 05/15/2016  . Tobacco use disorder 05/14/2016  . Borderline personality disorder (HCC) 05/14/2016  . PTSD (post-traumatic stress disorder) 05/13/2016  . Suicide attempt (HCC) 09/26/2011    Past Surgical History:  Procedure Laterality Date  . TONSILLECTOMY AND ADENOIDECTOMY  Age 15  . TOOTH EXTRACTION  age 55  . TYMPANOPLASTY      OB History   No obstetric history on file.      Home Medications    Prior to Admission medications   Medication Sig Start Date End Date Taking? Authorizing Provider  albuterol (PROVENTIL HFA;VENTOLIN HFA) 108 (90 Base) MCG/ACT inhaler Inhale 1-2 puffs into the lungs every 6 (six) hours as needed for wheezing or shortness of breath. 09/06/16  Yes  Payton Mccallum, MD  fluticasone (FLOVENT HFA) 110 MCG/ACT inhaler Inhale into the lungs. 08/18/18 08/18/19 Yes [provider]  ondansetron (ZOFRAN-ODT) 4 MG disintegrating tablet Take 1 tablet (4 mg total) by mouth every 8 (eight) hours as needed for nausea or vomiting. 07/19/19   Tommie Sams, DO  etonogestrel (NEXPLANON) 68 MG IMPL implant  08/20/17 05/30/19  [provider]  traZODone (DESYREL) 50 MG tablet Take 1 tablet (50 mg total) by mouth at bedtime. Patient taking differently: Take 150 mg by mouth at bedtime.  10/06/16 05/30/19  Pucilowska, Ellin Goodie, MD  venlafaxine XR (EFFEXOR-XR) 150 MG 24 hr capsule Take 150 mg by mouth daily with breakfast.  05/30/19  [provider]    Family History Family History  Problem Relation Age of Onset  . Mental illness Mother   . Depression Father     Social History Social History   Tobacco Use  . Smoking status: Current Every Day Smoker    Packs/day: 0.25    Types: Cigarettes  . Smokeless tobacco: Never Used  Substance Use Topics  . Alcohol use: Yes    Comment: uses less than once monthly  . Drug use: Not Currently    Types: Marijuana    Comment: last smoked a couple of days ago     Allergies   Folic acid, Prednisone, and Nickel   Review of Systems Review of Systems  Constitutional: Negative for fever.  Gastrointestinal: Positive for abdominal pain, nausea and vomiting.   Physical Exam Triage Vital Signs  ED Triage Vitals  Enc Vitals Group     BP 07/19/19 1118 114/74     Pulse Rate 07/19/19 1118 78     Resp 07/19/19 1118 18     Temp 07/19/19 1118 98 F (36.7 C)     Temp Source 07/19/19 1118 Oral     SpO2 07/19/19 1118 99 %     Weight 07/19/19 1116 200 lb (90.7 kg)     Height 07/19/19 1116 5\' 3"  (1.6 m)     Head Circumference --      Peak Flow --      Pain Score 07/19/19 1115 4     Pain Loc --      Pain Edu? --      Excl. in GC? --    No data found.  Updated Vital Signs BP 114/74 (BP  Location: Right Arm)   Pulse 78   Temp 98 F (36.7 C) (Oral)   Resp 18   Ht 5\' 3"  (1.6 m)   Wt 90.7 kg   LMP 07/04/2019   SpO2 99%   BMI 35.43 kg/m   Visual Acuity Right Eye Distance:   Left Eye Distance:   Bilateral Distance:    Right Eye Near:   Left Eye Near:    Bilateral Near:     Physical Exam Constitutional:      General: She is not in acute distress.    Appearance: She is well-developed. She is obese. She is not ill-appearing.  HENT:     Head: Normocephalic and atraumatic.  Eyes:     General:        Right eye: No discharge.        Left eye: No discharge.     Conjunctiva/sclera: Conjunctivae normal.  Cardiovascular:     Rate and Rhythm: Normal rate and regular rhythm.     Heart sounds: No murmur.  Pulmonary:     Effort: Pulmonary effort is normal.     Breath sounds: Normal breath sounds. No wheezing, rhonchi or rales.  Abdominal:     General: There is no distension.     Palpations: Abdomen is soft.     Tenderness: There is no guarding or rebound.     Comments: Tenderness throughout the abdomen.  No focality to exam.  Neurological:     Mental Status: She is alert.  Psychiatric:        Behavior: Behavior normal.     Comments: Flat affect.    UC Treatments / Results  Labs (all labs ordered are listed, but only abnormal results are displayed) Labs Reviewed - No data to display  EKG   Radiology No results found.  Procedures Procedures (including critical care time)  Medications Ordered in UC Medications - No data to display  Initial Impression / Assessment and Plan / UC Course  I have reviewed the triage vital signs and the nursing notes.  Pertinent labs & imaging results that were available during my care of the patient were reviewed by me and considered in my medical decision making (see chart for details).    23 year old female presents with abdominal pain, nausea, vomiting.  Afebrile and well-appearing on exam.  Recent laboratory studies  essentially unrevealing.  No evidence of UTI on urinalysis.  No evidence of acute abdomen.  Zofran as needed.  Work note given.  Final Clinical Impressions(s) / UC Diagnoses   Final diagnoses:  Generalized abdominal pain  Non-intractable vomiting with nausea, unspecified vomiting type     Discharge  Instructions     Rest.  Fluids.  Medication as needed.  Take care  Dr. Lacinda Axon    ED Prescriptions    Medication Sig Dispense Auth. Provider   ondansetron (ZOFRAN-ODT) 4 MG disintegrating tablet Take 1 tablet (4 mg total) by mouth every 8 (eight) hours as needed for nausea or vomiting. 20 tablet Coral Spikes, DO     PDMP not reviewed this encounter.   Coral Spikes, [place] 07/19/19 1246

## 2019-07-19 NOTE — ED Triage Notes (Signed)
Patient c/o abdominal pain that started yesterday evening. She reports vomiting x 3. States she went to [facility] and had her blood drawn and gave a urine specimen but she left because hospitals give her anxiety.

## 2019-07-19 NOTE — Discharge Instructions (Signed)
Rest. Fluids. ° °Medication as needed. ° °Take care ° °Dr. Michele Kerlin  °

## 2020-05-16 ENCOUNTER — Other Ambulatory Visit: Payer: Self-pay

## 2020-05-16 ENCOUNTER — Emergency Department
Admission: EM | Admit: 2020-05-16 | Discharge: 2020-05-16 | Disposition: A | Discharge status: Home / Self Care | Payer: Medicaid Other | Attending: Emergency Medicine | Admitting: Emergency Medicine

## 2020-05-16 DIAGNOSIS — F17210 Nicotine dependence, cigarettes, uncomplicated: Secondary | ICD-10-CM | POA: Diagnosis not present

## 2020-05-16 DIAGNOSIS — R300 Dysuria: Secondary | ICD-10-CM | POA: Diagnosis present

## 2020-05-16 DIAGNOSIS — N12 Tubulo-interstitial nephritis, not specified as acute or chronic: Secondary | ICD-10-CM | POA: Insufficient documentation

## 2020-05-16 DIAGNOSIS — Z20822 Contact with and (suspected) exposure to covid-19: Secondary | ICD-10-CM | POA: Diagnosis not present

## 2020-05-16 DIAGNOSIS — J45909 Unspecified asthma, uncomplicated: Secondary | ICD-10-CM | POA: Insufficient documentation

## 2020-05-16 LAB — URINALYSIS, COMPLETE (UACMP) WITH MICROSCOPIC
Bacteria, UA: NONE SEEN
Bilirubin Urine: NEGATIVE
Glucose, UA: NEGATIVE mg/dL
Ketones, ur: NEGATIVE mg/dL
Nitrite: POSITIVE — AB
Protein, ur: 30 mg/dL — AB
Specific Gravity, Urine: 1.014 (ref 1.005–1.030)
Squamous Epithelial / HPF: NONE SEEN (ref 0–5)
WBC, UA: >50 WBC/hpf — ABNORMAL HIGH (ref 0–5)
pH: 5 (ref 5.0–8.0)

## 2020-05-16 LAB — CBC WITH DIFFERENTIAL/PLATELET
Abs Immature Granulocytes: 0.1 10*3/uL — ABNORMAL HIGH (ref 0.00–0.07)
Basophils Absolute: 0.1 10*3/uL (ref 0.0–0.1)
Basophils Relative: 0 %
Eosinophils Absolute: 0 10*3/uL (ref 0.0–0.5)
Eosinophils Relative: 0 %
HCT: 38.2 % (ref 36.0–46.0)
Hemoglobin: 13.3 g/dL (ref 12.0–15.0)
Immature Granulocytes: 1 %
Lymphocytes Relative: 15 %
Lymphs Abs: 2.8 10*3/uL (ref 0.7–4.0)
MCH: 32.1 pg (ref 26.0–34.0)
MCHC: 34.8 g/dL (ref 30.0–36.0)
MCV: 92.3 fL (ref 80.0–100.0)
Monocytes Absolute: 1.7 10*3/uL — ABNORMAL HIGH (ref 0.1–1.0)
Monocytes Relative: 9 %
Neutro Abs: 14.1 10*3/uL — ABNORMAL HIGH (ref 1.7–7.7)
Neutrophils Relative %: 75 %
Platelets: 340 10*3/uL (ref 150–400)
RBC: 4.14 MIL/uL (ref 3.87–5.11)
RDW: 12.1 % (ref 11.5–15.5)
WBC: 18.8 10*3/uL — ABNORMAL HIGH (ref 4.0–10.5)
nRBC: 0 % (ref 0.0–0.2)

## 2020-05-16 LAB — COMPREHENSIVE METABOLIC PANEL
ALT: 14 U/L (ref 0–44)
AST: 17 U/L (ref 15–41)
Albumin: 4.1 g/dL (ref 3.5–5.0)
Alkaline Phosphatase: 64 U/L (ref 38–126)
Anion gap: 10 (ref 5–15)
BUN: 9 mg/dL (ref 6–20)
CO2: 23 mmol/L (ref 22–32)
Calcium: 9.2 mg/dL (ref 8.9–10.3)
Chloride: 98 mmol/L (ref 98–111)
Creatinine, Ser: 0.57 mg/dL (ref 0.44–1.00)
GFR, Estimated: >60 mL/min (ref >60)
Glucose, Bld: 89 mg/dL (ref 70–99)
Potassium: 3.8 mmol/L (ref 3.5–5.1)
Sodium: 131 mmol/L — ABNORMAL LOW (ref 135–145)
Total Bilirubin: 1.2 mg/dL (ref 0.3–1.2)
Total Protein: 8.1 g/dL (ref 6.5–8.1)

## 2020-05-16 LAB — RESPIRATORY PANEL BY RT PCR (FLU A&B, COVID)
Influenza A by PCR: NEGATIVE
Influenza B by PCR: NEGATIVE
SARS Coronavirus 2 by RT PCR: NEGATIVE

## 2020-05-16 MED ORDER — ACETAMINOPHEN 500 MG PO TABS
1000.0000 mg | ORAL_TABLET | Freq: Once | ORAL | Status: AC
  Administered 2020-05-16: 1000 mg via ORAL
  Filled 2020-05-16: qty 2

## 2020-05-16 MED ORDER — CEPHALEXIN 500 MG PO CAPS: 500.0000 mg | ORAL_CAPSULE | Freq: Four times a day (QID) | ORAL | 0 refills | Status: AC

## 2020-05-16 MED ORDER — SODIUM CHLORIDE 0.9 % IV BOLUS
1000.0000 mL | Freq: Once | INTRAVENOUS | Status: AC
  Administered 2020-05-16: 1000 mL via INTRAVENOUS

## 2020-05-16 MED ORDER — SODIUM CHLORIDE 0.9 % IV SOLN
1.0000 g | Freq: Once | INTRAVENOUS | Status: AC
  Administered 2020-05-16: 1 g via INTRAVENOUS
  Filled 2020-05-16: qty 10

## 2020-05-16 NOTE — ED Notes (Signed)
This RN unsuccessful to start IV. Other RN will attempt, provider notified

## 2020-05-16 NOTE — ED Triage Notes (Addendum)
Pt to ED POV with complaint of back pain, generalized body aches, chills that started yesterday afternoon.  Denies SHOB, CP RR even and unlabored, speaking in complete sentences

## 2020-05-16 NOTE — ED Notes (Signed)
Upon assessment of IV site, pt states that she uses IV meth. Bruising noted to left AC, where pt last used yesterday. Provider notified

## 2020-05-16 NOTE — Discharge Instructions (Addendum)
Take Keflex four times daily for the next seven days.  

## 2020-05-16 NOTE — ED Notes (Signed)
Pt reports the approx 1 week ago she had some mild burning with urination. She states that she drank a lot of cranberry juice and it went away. Pt reports period began two days ago. Pt reports yesterday she began to have generalized body aches, chills, mild burning with urination, and stabbing right sided flank pain.   Pt also presents with dry non-productive cough, which she says is her baseline due to asthma.

## 2020-05-16 NOTE — ED Provider Notes (Signed)
Emergency Department Provider Note  ____________________________________________  Time seen: Approximately 4:49 PM  I have reviewed the triage vital signs and the nursing notes.   HISTORY  Chief Complaint Generalized Body Aches   Historian Patient    HPI Morgan Meadows is a 24 y.o. female presents to the emergency department with dysuria, body aches, chills, fever and left-sided low back pain that has been going on for approximately 1 week.  Patient states that she has had some nausea but no vomiting.  She denies changes in vaginal discharge or dyspareunia.  She has no concerns for STDs.  Patient is unsure about the possibility of pregnancy.  Patient denies a history of pyelonephritis or nephrolithiasis.   Past Medical History:  Diagnosis Date  . ADHD (attention deficit hyperactivity disorder)   . Asthma   . Depression      Immunizations up to date:  Yes.     Past Medical History:  Diagnosis Date  . ADHD (attention deficit hyperactivity disorder)   . Asthma   . Depression     Patient Active Problem List   Diagnosis Date Noted  . Suicidal ideation 10/03/2016  . Severe recurrent major depression without psychotic features (HCC) 10/02/2016  . MDD (major depressive disorder), recurrent episode, moderate (HCC) 05/15/2016  . Cannabis use disorder, moderate, dependence (HCC) 05/15/2016  . Methamphetamine use disorder, moderate (HCC) 05/15/2016  . Tobacco use disorder 05/14/2016  . Borderline personality disorder (HCC) 05/14/2016  . PTSD (post-traumatic stress disorder) 05/13/2016  . Suicide attempt (HCC) 09/26/2011    Past Surgical History:  Procedure Laterality Date  . TONSILLECTOMY AND ADENOIDECTOMY  Age 27  . TOOTH EXTRACTION  age 89  . TYMPANOPLASTY      Prior to Admission medications   Medication Sig Start Date End Date Taking? Authorizing Provider  albuterol (PROVENTIL HFA;VENTOLIN HFA) 108 (90 Base) MCG/ACT inhaler Inhale 1-2 puffs into the lungs  every 6 (six) hours as needed for wheezing or shortness of breath. 09/06/16   Payton Mccallum, MD  cephALEXin (KEFLEX) 500 MG capsule Take 1 capsule (500 mg total) by mouth 4 (four) times daily for 7 days. 05/16/20 05/23/20  Pia Mau M, PA-C  fluticasone (FLOVENT HFA) 110 MCG/ACT inhaler Inhale into the lungs. 08/18/18 08/18/19  [provider]  ondansetron (ZOFRAN-ODT) 4 MG disintegrating tablet Take 1 tablet (4 mg total) by mouth every 8 (eight) hours as needed for nausea or vomiting. 07/19/19   Tommie Sams, DO  etonogestrel (NEXPLANON) 68 MG IMPL implant  08/20/17 05/30/19  [provider]  traZODone (DESYREL) 50 MG tablet Take 1 tablet (50 mg total) by mouth at bedtime. Patient taking differently: Take 150 mg by mouth at bedtime.  10/06/16 05/30/19  Pucilowska, Ellin Goodie, MD  venlafaxine XR (EFFEXOR-XR) 150 MG 24 hr capsule Take 150 mg by mouth daily with breakfast.  05/30/19  [provider]    Allergies Folic acid, Prednisone, and Nickel  Family History  Problem Relation Age of Onset  . Mental illness Mother   . Depression Father     Social History Social History   Tobacco Use  . Smoking status: Current Every Day Smoker    Packs/day: 0.25    Types: Cigarettes  . Smokeless tobacco: Never Used  Vaping Use  . Vaping Use: Never used  Substance Use Topics  . Alcohol use: Yes    Comment: uses less than once monthly  . Drug use: Not Currently    Types: Marijuana, Methamphetamines    Comment:  last smoked a couple of days ago     Review of Systems  Constitutional: No fever/chills Eyes:  No discharge ENT: No upper respiratory complaints. Respiratory: no cough. No SOB/ use of accessory muscles to breath Gastrointestinal:   No nausea, no vomiting.  No diarrhea.  No constipation. Musculoskeletal: Patient has low back pain.  Skin: Negative for rash, abrasions, lacerations, ecchymosis.    ____________________________________________   PHYSICAL  EXAM:  VITAL SIGNS: ED Triage Vitals  Enc Vitals Group     BP 05/16/20 1409 107/60     Pulse Rate 05/16/20 1409 (!) 118     Resp 05/16/20 1409 20     Temp 05/16/20 1409 (!) 100.7 F (38.2 C)     Temp Source 05/16/20 1409 Oral     SpO2 05/16/20 1409 99 %     Weight 05/16/20 1410 170 lb (77.1 kg)     Height 05/16/20 1410 5\' 3"  (1.6 m)     Head Circumference --      Peak Flow --      Pain Score 05/16/20 1410 9     Pain Loc --      Pain Edu? --      Excl. in GC? --      Constitutional: Alert and oriented. Well appearing and in no acute distress. Eyes: Conjunctivae are normal. PERRL. EOMI. Head: Atraumatic. ENT:      Ears: TMs are pearly.       Nose: No congestion/rhinnorhea.      Mouth/Throat: Mucous membranes are moist.  Neck: No stridor. FROM.  Cardiovascular: Normal rate, regular rhythm. Normal S1 and S2.  Good peripheral circulation. Respiratory: Normal respiratory effort without tachypnea or retractions. Lungs CTAB. Good air entry to the bases with no decreased or absent breath sounds Gastrointestinal: Bowel sounds x 4 quadrants. Soft and nontender to palpation. No guarding or rigidity. No distention. Genitourinary: Patient has left-sided CVA tenderness. Musculoskeletal: Full range of motion to all extremities. No obvious deformities noted Neurologic:  Normal for age. No gross focal neurologic deficits are appreciated.  Skin:  Skin is warm, dry and intact. No rash noted. Psychiatric: Mood and affect are normal for age. Speech and behavior are normal.   ____________________________________________   LABS (all labs ordered are listed, but only abnormal results are displayed)  Labs Reviewed  URINALYSIS, COMPLETE (UACMP) WITH MICROSCOPIC - Abnormal; Notable for the following components:      Result Value   Color, Urine YELLOW (*)    APPearance TURBID (*)    Hgb urine dipstick SMALL (*)    Protein, ur 30 (*)    Nitrite POSITIVE (*)    Leukocytes,Ua MODERATE (*)     WBC, UA >50 (*)    All other components within normal limits  CBC WITH DIFFERENTIAL/PLATELET - Abnormal; Notable for the following components:   WBC 18.8 (*)    Neutro Abs 14.1 (*)    Monocytes Absolute 1.7 (*)    Abs Immature Granulocytes 0.10 (*)    All other components within normal limits  COMPREHENSIVE METABOLIC PANEL - Abnormal; Notable for the following components:   Sodium 131 (*)    All other components within normal limits  RESPIRATORY PANEL BY RT PCR (FLU A&B, COVID)  URINE CULTURE  PREGNANCY, URINE   ____________________________________________  EKG   ____________________________________________  RADIOLOGY  No results found.  ____________________________________________    PROCEDURES  Procedure(s) performed:     Procedures     Medications  cefTRIAXone (ROCEPHIN) 1 g in  sodium chloride 0.9 % 100 mL IVPB (1 g Intravenous New Bag/Given 05/16/20 1840)  acetaminophen (TYLENOL) tablet 1,000 mg (1,000 mg Oral Given 05/16/20 1748)  sodium chloride 0.9 % bolus 1,000 mL (1,000 mLs Intravenous New Bag/Given 05/16/20 1809)     ____________________________________________   INITIAL IMPRESSION / ASSESSMENT AND PLAN / ED COURSE  Pertinent labs & imaging results that were available during my care of the patient were reviewed by me and considered in my medical decision making (see chart for details).      Assessment and Plan:  Pyelonephritis 24 year old female presents to the emergency department with left-sided flank pain, dysuria and fever.  Patient was febrile and tachycardic at triage.  Heart rate and fever trended down with antipyretics alone in the emergency department.  CBC indicated leukocytosis with left shift and urinalysis was concerning for urinary tract infection.  Patient was given IV Rocephin and supplemental fluids.  She was discharged with Keflex to be taken 4 times daily for the next 7 days.  Return precautions were given to return with new  or worsening symptoms.  All patient questions were answered.   ____________________________________________  FINAL CLINICAL IMPRESSION(S) / ED DIAGNOSES  Final diagnoses:  Pyelonephritis      NEW MEDICATIONS STARTED DURING THIS VISIT:  ED Discharge Orders         Ordered    cephALEXin (KEFLEX) 500 MG capsule  4 times daily        05/16/20 1905              This chart was dictated using voice recognition software/Dragon. Despite best efforts to proofread, errors can occur which can change the meaning. Any change was purely unintentional.     Orvil Feil, PA-C 05/16/20 Roxan Hockey, MD 05/18/20 1540

## 2020-05-19 LAB — URINE CULTURE: Culture: >=100000 — AB

## 2020-06-07 ENCOUNTER — Other Ambulatory Visit: Payer: Self-pay | Admitting: Pediatrics

## 2020-06-07 DIAGNOSIS — R2242 Localized swelling, mass and lump, left lower limb: Secondary | ICD-10-CM

## 2020-06-08 ENCOUNTER — Ambulatory Visit: Payer: Medicaid Other | Admitting: Advanced Practice Midwife

## 2020-06-08 ENCOUNTER — Encounter: Payer: Self-pay | Admitting: Advanced Practice Midwife

## 2020-06-08 ENCOUNTER — Other Ambulatory Visit: Payer: Self-pay

## 2020-06-08 DIAGNOSIS — Z113 Encounter for screening for infections with a predominantly sexual mode of transmission: Secondary | ICD-10-CM

## 2020-06-08 DIAGNOSIS — T7422XA Child sexual abuse, confirmed, initial encounter: Secondary | ICD-10-CM | POA: Insufficient documentation

## 2020-06-08 DIAGNOSIS — F331 Major depressive disorder, recurrent, moderate: Secondary | ICD-10-CM

## 2020-06-08 DIAGNOSIS — F1220 Cannabis dependence, uncomplicated: Secondary | ICD-10-CM

## 2020-06-08 DIAGNOSIS — F1520 Other stimulant dependence, uncomplicated: Secondary | ICD-10-CM

## 2020-06-08 DIAGNOSIS — F1190 Opioid use, unspecified, uncomplicated: Secondary | ICD-10-CM | POA: Insufficient documentation

## 2020-06-08 DIAGNOSIS — T7422XS Child sexual abuse, confirmed, sequela: Secondary | ICD-10-CM

## 2020-06-08 MED ORDER — AZITHROMYCIN 500 MG PO TABS
1000.0000 mg | ORAL_TABLET | Freq: Once | ORAL | Status: AC
  Administered 2020-06-08: 1000 mg via ORAL

## 2020-06-08 NOTE — Progress Notes (Signed)
UPT is negative today. Wet mount reviewed and is negative today, so no treatment needed for wet mount per standing order. Pt treated for Chlamydia with Azithromycin 1g po DOT per provider verbal order. Pt given [facility] with contact info per pt request. Counseled pt per provider orders and pt states understanding. Provider orders completed.
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
## 2020-06-09 LAB — WET PREP FOR TRICH, YEAST, CLUE
Trichomonas Exam: NEGATIVE
Yeast Exam: NEGATIVE

## 2020-06-09 LAB — PREGNANCY, URINE: Preg Test, Ur: NEGATIVE

## 2020-06-19 ENCOUNTER — Telehealth: Payer: Self-pay

## 2020-06-19 DIAGNOSIS — A749 Chlamydial infection, unspecified: Secondary | ICD-10-CM

## 2020-06-19 NOTE — Telephone Encounter (Signed)
Attempted TC to patient. [facility] RN.  Richmond Campbell, RN   Tx'd at visit for chlamydia. CD report completed.

## 2020-06-20 LAB — HEPATITIS B SURFACE ANTIGEN
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
## 2020-09-02 ENCOUNTER — Ambulatory Visit
Admission: EM | Admit: 2020-09-02 | Discharge: 2020-09-02 | Disposition: A | Discharge status: Home / Self Care | Payer: Medicaid Other | Attending: Emergency Medicine | Admitting: Emergency Medicine

## 2020-09-02 ENCOUNTER — Other Ambulatory Visit: Payer: Self-pay

## 2020-09-02 DIAGNOSIS — Z79899 Other long term (current) drug therapy: Secondary | ICD-10-CM | POA: Diagnosis not present

## 2020-09-02 DIAGNOSIS — Z6827 Body mass index (BMI) 27.0-27.9, adult: Secondary | ICD-10-CM | POA: Diagnosis not present

## 2020-09-02 DIAGNOSIS — J069 Acute upper respiratory infection, unspecified: Secondary | ICD-10-CM

## 2020-09-02 DIAGNOSIS — Z7951 Long term (current) use of inhaled steroids: Secondary | ICD-10-CM | POA: Insufficient documentation

## 2020-09-02 DIAGNOSIS — F17210 Nicotine dependence, cigarettes, uncomplicated: Secondary | ICD-10-CM | POA: Diagnosis not present

## 2020-09-02 DIAGNOSIS — Z20822 Contact with and (suspected) exposure to covid-19: Secondary | ICD-10-CM | POA: Diagnosis not present

## 2020-09-02 DIAGNOSIS — J45909 Unspecified asthma, uncomplicated: Secondary | ICD-10-CM | POA: Insufficient documentation

## 2020-09-02 MED ORDER — ALBUTEROL SULFATE HFA 108 (90 BASE) MCG/ACT IN AERS: 2.0000 | INHALATION_SPRAY | RESPIRATORY_TRACT | 0 refills | Status: AC | PRN

## 2020-09-02 MED ORDER — PROMETHAZINE-DM 6.25-15 MG/5ML PO SYRP: 5.0000 mL | ORAL_SOLUTION | Freq: Four times a day (QID) | ORAL | 0 refills | Status: AC | PRN

## 2020-09-02 MED ORDER — AEROCHAMBER MV MISC: 2 refills | Status: AC

## 2020-09-02 MED ORDER — BENZONATATE 100 MG PO CAPS: 200.0000 mg | ORAL_CAPSULE | Freq: Three times a day (TID) | ORAL | 0 refills | Status: AC
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
## 2020-09-02 NOTE — Discharge Instructions (Addendum)
Isolate at home pending the results of your COVID test.  If you test positive then you will have to quarantine for 5 days from the start of your symptoms.  After 5 days you can break quarantine if your symptoms have improved and you have not had a fever for 24 hours without taking Tylenol or ibuprofen.  Use over-the-counter Tylenol and ibuprofen as needed for body aches and fever.  Use the Tessalon Perles during the day as needed for cough and the Promethazine DM cough syrup at nighttime as will make you drowsy.  If you develop any increased shortness of breath-especially at rest, you are unable to speak in full sentences, or is a late sign your lips are turning blue you need to go the ER for evaluation.  

## 2020-09-02 NOTE — ED Triage Notes (Signed)
Patient states that she has been having a cough, nasal congestion, sore throat and body aches x 3 days. States that she was sent home from work early and asked to get a covid test.

## 2020-09-03 LAB — SARS CORONAVIRUS 2 (TAT 6-24 HRS): SARS Coronavirus 2: NEGATIVE

## 2021-03-26 ENCOUNTER — Encounter: Payer: Self-pay | Admitting: Emergency Medicine

## 2021-03-26 ENCOUNTER — Emergency Department
Admission: EM | Admit: 2021-03-26 | Discharge: 2021-03-26 | Disposition: A | Discharge status: Home / Self Care | Payer: Medicaid Other | Attending: Emergency Medicine | Admitting: Emergency Medicine

## 2021-03-26 DIAGNOSIS — Z7951 Long term (current) use of inhaled steroids: Secondary | ICD-10-CM | POA: Insufficient documentation

## 2021-03-26 DIAGNOSIS — T40601A Poisoning by unspecified narcotics, accidental (unintentional), initial encounter: Secondary | ICD-10-CM

## 2021-03-26 DIAGNOSIS — J45909 Unspecified asthma, uncomplicated: Secondary | ICD-10-CM | POA: Diagnosis not present

## 2021-03-26 DIAGNOSIS — T402X1A Poisoning by other opioids, accidental (unintentional), initial encounter: Secondary | ICD-10-CM | POA: Insufficient documentation

## 2021-03-26 DIAGNOSIS — F17210 Nicotine dependence, cigarettes, uncomplicated: Secondary | ICD-10-CM | POA: Insufficient documentation

## 2021-03-26 MED ORDER — NALOXONE HCL 2 MG/2ML IJ SOSY: PREFILLED_SYRINGE | INTRAMUSCULAR | 0 refills | Status: AC
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
## 2021-03-26 NOTE — ED Triage Notes (Signed)
Pt arrived via [facility] from home where she was found lethargic with agonal breathing by family. Family administered 1 IM Narcan home kit to the pt thigh with no response. Pt received 2 more IM shots of NARCAN by first responders with response. Pt reports she did 1 line of Heroine approx 1830 and was found around 1900. Pt reports hx/o use but clean for past 6 months.
